# Patient Record
Sex: Male | Born: 1935 | Race: White | Hispanic: No | Marital: Married | Smoking: Never smoker
Health system: Southern US, Community
[De-identification: ages and names within clinical notes are randomized; demographics above are authoritative.]

## PROBLEM LIST (undated history)

## (undated) DIAGNOSIS — T8859XA Other complications of anesthesia, initial encounter: Secondary | ICD-10-CM

## (undated) DIAGNOSIS — Z9889 Other specified postprocedural states: Secondary | ICD-10-CM

## (undated) DIAGNOSIS — M1990 Unspecified osteoarthritis, unspecified site: Secondary | ICD-10-CM

## (undated) DIAGNOSIS — C801 Malignant (primary) neoplasm, unspecified: Secondary | ICD-10-CM

## (undated) DIAGNOSIS — R112 Nausea with vomiting, unspecified: Secondary | ICD-10-CM

## (undated) DIAGNOSIS — T4145XA Adverse effect of unspecified anesthetic, initial encounter: Secondary | ICD-10-CM

## (undated) DIAGNOSIS — H409 Unspecified glaucoma: Secondary | ICD-10-CM

## (undated) HISTORY — PX: HEMORRHOID SURGERY: SHX153

## (undated) HISTORY — PX: TONSILLECTOMY: SUR1361

## (undated) HISTORY — PX: COLONOSCOPY: SHX174

---
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
## 1997-11-30 HISTORY — PX: PROSTATE SURGERY: SHX751

## 2012-08-15 DIAGNOSIS — H409 Unspecified glaucoma: Secondary | ICD-10-CM | POA: Insufficient documentation

## 2012-08-15 DIAGNOSIS — H269 Unspecified cataract: Secondary | ICD-10-CM | POA: Insufficient documentation

## 2016-01-21 DIAGNOSIS — F325 Major depressive disorder, single episode, in full remission: Secondary | ICD-10-CM | POA: Diagnosis not present

## 2016-02-05 DIAGNOSIS — H401131 Primary open-angle glaucoma, bilateral, mild stage: Secondary | ICD-10-CM | POA: Diagnosis not present

## 2016-02-11 DIAGNOSIS — E784 Other hyperlipidemia: Secondary | ICD-10-CM | POA: Diagnosis not present

## 2016-02-11 DIAGNOSIS — Z125 Encounter for screening for malignant neoplasm of prostate: Secondary | ICD-10-CM | POA: Diagnosis not present

## 2016-02-17 DIAGNOSIS — E784 Other hyperlipidemia: Secondary | ICD-10-CM | POA: Diagnosis not present

## 2016-02-17 DIAGNOSIS — L570 Actinic keratosis: Secondary | ICD-10-CM | POA: Diagnosis not present

## 2016-02-17 DIAGNOSIS — Z6828 Body mass index (BMI) 28.0-28.9, adult: Secondary | ICD-10-CM | POA: Diagnosis not present

## 2016-02-17 DIAGNOSIS — Z0000 Encounter for general adult medical examination without abnormal findings: Secondary | ICD-10-CM | POA: Diagnosis not present

## 2016-02-17 DIAGNOSIS — D126 Benign neoplasm of colon, unspecified: Secondary | ICD-10-CM | POA: Diagnosis not present

## 2016-02-17 DIAGNOSIS — C61 Malignant neoplasm of prostate: Secondary | ICD-10-CM | POA: Diagnosis not present

## 2016-02-17 DIAGNOSIS — M859 Disorder of bone density and structure, unspecified: Secondary | ICD-10-CM | POA: Diagnosis not present

## 2016-02-17 DIAGNOSIS — H4089 Other specified glaucoma: Secondary | ICD-10-CM | POA: Diagnosis not present

## 2016-02-17 DIAGNOSIS — Z1389 Encounter for screening for other disorder: Secondary | ICD-10-CM | POA: Diagnosis not present

## 2016-03-17 DIAGNOSIS — Z85828 Personal history of other malignant neoplasm of skin: Secondary | ICD-10-CM | POA: Diagnosis not present

## 2016-03-17 DIAGNOSIS — L821 Other seborrheic keratosis: Secondary | ICD-10-CM | POA: Diagnosis not present

## 2016-03-17 DIAGNOSIS — Z08 Encounter for follow-up examination after completed treatment for malignant neoplasm: Secondary | ICD-10-CM | POA: Diagnosis not present

## 2016-09-08 DIAGNOSIS — Z23 Encounter for immunization: Secondary | ICD-10-CM | POA: Diagnosis not present

## 2016-09-22 DIAGNOSIS — D0439 Carcinoma in situ of skin of other parts of face: Secondary | ICD-10-CM | POA: Diagnosis not present

## 2016-09-22 DIAGNOSIS — L570 Actinic keratosis: Secondary | ICD-10-CM | POA: Diagnosis not present

## 2016-09-22 DIAGNOSIS — L821 Other seborrheic keratosis: Secondary | ICD-10-CM | POA: Diagnosis not present

## 2016-09-22 DIAGNOSIS — Z85828 Personal history of other malignant neoplasm of skin: Secondary | ICD-10-CM | POA: Diagnosis not present

## 2016-09-22 DIAGNOSIS — Z08 Encounter for follow-up examination after completed treatment for malignant neoplasm: Secondary | ICD-10-CM | POA: Diagnosis not present

## 2016-10-13 DIAGNOSIS — E785 Hyperlipidemia, unspecified: Secondary | ICD-10-CM | POA: Diagnosis not present

## 2016-10-20 DIAGNOSIS — H409 Unspecified glaucoma: Secondary | ICD-10-CM | POA: Diagnosis not present

## 2016-10-20 DIAGNOSIS — R5383 Other fatigue: Secondary | ICD-10-CM | POA: Diagnosis not present

## 2016-10-20 DIAGNOSIS — C61 Malignant neoplasm of prostate: Secondary | ICD-10-CM | POA: Diagnosis not present

## 2016-10-20 DIAGNOSIS — E785 Hyperlipidemia, unspecified: Secondary | ICD-10-CM | POA: Diagnosis not present

## 2016-10-20 DIAGNOSIS — Z23 Encounter for immunization: Secondary | ICD-10-CM | POA: Diagnosis not present

## 2016-10-20 DIAGNOSIS — Z0000 Encounter for general adult medical examination without abnormal findings: Secondary | ICD-10-CM | POA: Diagnosis not present

## 2017-03-15 DIAGNOSIS — Z125 Encounter for screening for malignant neoplasm of prostate: Secondary | ICD-10-CM | POA: Diagnosis not present

## 2017-03-15 DIAGNOSIS — E784 Other hyperlipidemia: Secondary | ICD-10-CM | POA: Diagnosis not present

## 2017-03-15 DIAGNOSIS — M859 Disorder of bone density and structure, unspecified: Secondary | ICD-10-CM | POA: Diagnosis not present

## 2017-03-22 DIAGNOSIS — E784 Other hyperlipidemia: Secondary | ICD-10-CM | POA: Diagnosis not present

## 2017-03-22 DIAGNOSIS — Z1389 Encounter for screening for other disorder: Secondary | ICD-10-CM | POA: Diagnosis not present

## 2017-03-22 DIAGNOSIS — C61 Malignant neoplasm of prostate: Secondary | ICD-10-CM | POA: Diagnosis not present

## 2017-03-22 DIAGNOSIS — M859 Disorder of bone density and structure, unspecified: Secondary | ICD-10-CM | POA: Diagnosis not present

## 2017-03-22 DIAGNOSIS — Z6828 Body mass index (BMI) 28.0-28.9, adult: Secondary | ICD-10-CM | POA: Diagnosis not present

## 2017-03-22 DIAGNOSIS — Z23 Encounter for immunization: Secondary | ICD-10-CM | POA: Diagnosis not present

## 2017-03-22 DIAGNOSIS — Z0000 Encounter for general adult medical examination without abnormal findings: Secondary | ICD-10-CM | POA: Diagnosis not present

## 2017-03-22 DIAGNOSIS — D126 Benign neoplasm of colon, unspecified: Secondary | ICD-10-CM | POA: Diagnosis not present

## 2017-03-22 DIAGNOSIS — L570 Actinic keratosis: Secondary | ICD-10-CM | POA: Diagnosis not present

## 2017-03-22 DIAGNOSIS — H4089 Other specified glaucoma: Secondary | ICD-10-CM | POA: Diagnosis not present

## 2017-03-23 DIAGNOSIS — Z1212 Encounter for screening for malignant neoplasm of rectum: Secondary | ICD-10-CM | POA: Diagnosis not present

## 2017-03-23 DIAGNOSIS — Z08 Encounter for follow-up examination after completed treatment for malignant neoplasm: Secondary | ICD-10-CM | POA: Diagnosis not present

## 2017-03-23 DIAGNOSIS — L570 Actinic keratosis: Secondary | ICD-10-CM | POA: Diagnosis not present

## 2017-03-23 DIAGNOSIS — Z85828 Personal history of other malignant neoplasm of skin: Secondary | ICD-10-CM | POA: Diagnosis not present

## 2017-03-23 DIAGNOSIS — L821 Other seborrheic keratosis: Secondary | ICD-10-CM | POA: Diagnosis not present

## 2017-05-11 DIAGNOSIS — H401131 Primary open-angle glaucoma, bilateral, mild stage: Secondary | ICD-10-CM | POA: Diagnosis not present

## 2017-05-11 DIAGNOSIS — H524 Presbyopia: Secondary | ICD-10-CM | POA: Diagnosis not present

## 2017-05-28 DIAGNOSIS — I8311 Varicose veins of right lower extremity with inflammation: Secondary | ICD-10-CM | POA: Diagnosis not present

## 2017-05-28 DIAGNOSIS — I8312 Varicose veins of left lower extremity with inflammation: Secondary | ICD-10-CM | POA: Diagnosis not present

## 2017-05-28 DIAGNOSIS — C44712 Basal cell carcinoma of skin of right lower limb, including hip: Secondary | ICD-10-CM | POA: Diagnosis not present

## 2017-08-31 DIAGNOSIS — Z85828 Personal history of other malignant neoplasm of skin: Secondary | ICD-10-CM | POA: Diagnosis not present

## 2017-08-31 DIAGNOSIS — L821 Other seborrheic keratosis: Secondary | ICD-10-CM | POA: Diagnosis not present

## 2017-08-31 DIAGNOSIS — Z08 Encounter for follow-up examination after completed treatment for malignant neoplasm: Secondary | ICD-10-CM | POA: Diagnosis not present

## 2017-09-03 DIAGNOSIS — Z23 Encounter for immunization: Secondary | ICD-10-CM | POA: Diagnosis not present

## 2017-10-05 DIAGNOSIS — H401131 Primary open-angle glaucoma, bilateral, mild stage: Secondary | ICD-10-CM | POA: Diagnosis not present

## 2018-04-05 DIAGNOSIS — H524 Presbyopia: Secondary | ICD-10-CM | POA: Diagnosis not present

## 2018-04-05 DIAGNOSIS — H401131 Primary open-angle glaucoma, bilateral, mild stage: Secondary | ICD-10-CM | POA: Diagnosis not present

## 2018-04-07 DIAGNOSIS — L570 Actinic keratosis: Secondary | ICD-10-CM | POA: Diagnosis not present

## 2018-04-07 DIAGNOSIS — D485 Neoplasm of uncertain behavior of skin: Secondary | ICD-10-CM | POA: Diagnosis not present

## 2018-04-07 DIAGNOSIS — Z08 Encounter for follow-up examination after completed treatment for malignant neoplasm: Secondary | ICD-10-CM | POA: Diagnosis not present

## 2018-04-07 DIAGNOSIS — C44229 Squamous cell carcinoma of skin of left ear and external auricular canal: Secondary | ICD-10-CM | POA: Diagnosis not present

## 2018-04-07 DIAGNOSIS — Z85828 Personal history of other malignant neoplasm of skin: Secondary | ICD-10-CM | POA: Diagnosis not present

## 2018-04-07 DIAGNOSIS — L821 Other seborrheic keratosis: Secondary | ICD-10-CM | POA: Diagnosis not present

## 2018-04-14 DIAGNOSIS — Z125 Encounter for screening for malignant neoplasm of prostate: Secondary | ICD-10-CM | POA: Diagnosis not present

## 2018-04-14 DIAGNOSIS — R82998 Other abnormal findings in urine: Secondary | ICD-10-CM | POA: Diagnosis not present

## 2018-04-14 DIAGNOSIS — M859 Disorder of bone density and structure, unspecified: Secondary | ICD-10-CM | POA: Diagnosis not present

## 2018-04-14 DIAGNOSIS — E7849 Other hyperlipidemia: Secondary | ICD-10-CM | POA: Diagnosis not present

## 2018-04-21 DIAGNOSIS — C61 Malignant neoplasm of prostate: Secondary | ICD-10-CM | POA: Diagnosis not present

## 2018-04-21 DIAGNOSIS — Z0000 Encounter for general adult medical examination without abnormal findings: Secondary | ICD-10-CM | POA: Diagnosis not present

## 2018-04-21 DIAGNOSIS — Z6828 Body mass index (BMI) 28.0-28.9, adult: Secondary | ICD-10-CM | POA: Diagnosis not present

## 2018-04-21 DIAGNOSIS — D126 Benign neoplasm of colon, unspecified: Secondary | ICD-10-CM | POA: Diagnosis not present

## 2018-04-21 DIAGNOSIS — H4089 Other specified glaucoma: Secondary | ICD-10-CM | POA: Diagnosis not present

## 2018-04-21 DIAGNOSIS — H6123 Impacted cerumen, bilateral: Secondary | ICD-10-CM | POA: Diagnosis not present

## 2018-04-21 DIAGNOSIS — L570 Actinic keratosis: Secondary | ICD-10-CM | POA: Diagnosis not present

## 2018-04-21 DIAGNOSIS — E7849 Other hyperlipidemia: Secondary | ICD-10-CM | POA: Diagnosis not present

## 2018-04-21 DIAGNOSIS — M859 Disorder of bone density and structure, unspecified: Secondary | ICD-10-CM | POA: Diagnosis not present

## 2018-04-21 DIAGNOSIS — Z1389 Encounter for screening for other disorder: Secondary | ICD-10-CM | POA: Diagnosis not present

## 2018-04-22 DIAGNOSIS — Z1212 Encounter for screening for malignant neoplasm of rectum: Secondary | ICD-10-CM | POA: Diagnosis not present

## 2018-05-31 DIAGNOSIS — H401131 Primary open-angle glaucoma, bilateral, mild stage: Secondary | ICD-10-CM | POA: Diagnosis not present

## 2018-08-30 DIAGNOSIS — Z23 Encounter for immunization: Secondary | ICD-10-CM | POA: Diagnosis not present

## 2018-09-15 DIAGNOSIS — R5383 Other fatigue: Secondary | ICD-10-CM | POA: Diagnosis not present

## 2018-09-15 DIAGNOSIS — Z6828 Body mass index (BMI) 28.0-28.9, adult: Secondary | ICD-10-CM | POA: Diagnosis not present

## 2018-09-15 DIAGNOSIS — E875 Hyperkalemia: Secondary | ICD-10-CM | POA: Diagnosis not present

## 2018-09-15 DIAGNOSIS — R05 Cough: Secondary | ICD-10-CM | POA: Diagnosis not present

## 2018-09-16 DIAGNOSIS — E875 Hyperkalemia: Secondary | ICD-10-CM | POA: Diagnosis not present

## 2018-09-26 ENCOUNTER — Other Ambulatory Visit: Payer: Self-pay | Admitting: Internal Medicine

## 2018-09-26 ENCOUNTER — Ambulatory Visit
Admission: RE | Admit: 2018-09-26 | Discharge: 2018-09-26 | Disposition: A | Discharge status: Home / Self Care | Payer: Self-pay | Source: Ambulatory Visit | Attending: Internal Medicine | Admitting: Internal Medicine

## 2018-09-26 DIAGNOSIS — R945 Abnormal results of liver function studies: Principal | ICD-10-CM

## 2018-09-26 DIAGNOSIS — R05 Cough: Secondary | ICD-10-CM | POA: Diagnosis not present

## 2018-09-26 DIAGNOSIS — R509 Fever, unspecified: Secondary | ICD-10-CM | POA: Diagnosis not present

## 2018-09-26 DIAGNOSIS — D72829 Elevated white blood cell count, unspecified: Secondary | ICD-10-CM | POA: Diagnosis not present

## 2018-09-26 DIAGNOSIS — R7989 Other specified abnormal findings of blood chemistry: Secondary | ICD-10-CM | POA: Diagnosis not present

## 2018-09-26 DIAGNOSIS — H6692 Otitis media, unspecified, left ear: Secondary | ICD-10-CM | POA: Diagnosis not present

## 2018-09-26 DIAGNOSIS — Z6827 Body mass index (BMI) 27.0-27.9, adult: Secondary | ICD-10-CM | POA: Diagnosis not present

## 2018-09-26 DIAGNOSIS — J069 Acute upper respiratory infection, unspecified: Secondary | ICD-10-CM | POA: Diagnosis not present

## 2018-09-26 MED ORDER — IOPAMIDOL (ISOVUE-300) INJECTION 61%
100.0000 mL | Freq: Once | INTRAVENOUS | Status: AC | PRN
  Administered 2018-09-26: 100 mL via INTRAVENOUS

## 2018-10-03 DIAGNOSIS — K819 Cholecystitis, unspecified: Secondary | ICD-10-CM | POA: Diagnosis not present

## 2018-10-03 DIAGNOSIS — R509 Fever, unspecified: Secondary | ICD-10-CM | POA: Diagnosis not present

## 2018-10-03 DIAGNOSIS — J069 Acute upper respiratory infection, unspecified: Secondary | ICD-10-CM | POA: Diagnosis not present

## 2018-10-03 DIAGNOSIS — Z6827 Body mass index (BMI) 27.0-27.9, adult: Secondary | ICD-10-CM | POA: Diagnosis not present

## 2018-10-03 DIAGNOSIS — D72829 Elevated white blood cell count, unspecified: Secondary | ICD-10-CM | POA: Diagnosis not present

## 2018-10-03 DIAGNOSIS — R7989 Other specified abnormal findings of blood chemistry: Secondary | ICD-10-CM | POA: Diagnosis not present

## 2018-10-03 DIAGNOSIS — H538 Other visual disturbances: Secondary | ICD-10-CM | POA: Diagnosis not present

## 2018-10-03 DIAGNOSIS — H6692 Otitis media, unspecified, left ear: Secondary | ICD-10-CM | POA: Diagnosis not present

## 2018-10-04 DIAGNOSIS — H2513 Age-related nuclear cataract, bilateral: Secondary | ICD-10-CM | POA: Diagnosis not present

## 2018-10-04 DIAGNOSIS — H401131 Primary open-angle glaucoma, bilateral, mild stage: Secondary | ICD-10-CM | POA: Diagnosis not present

## 2018-10-06 ENCOUNTER — Other Ambulatory Visit: Payer: Self-pay | Admitting: Surgery

## 2018-10-06 ENCOUNTER — Other Ambulatory Visit (HOSPITAL_COMMUNITY): Payer: Self-pay | Admitting: Surgery

## 2018-10-06 DIAGNOSIS — K8020 Calculus of gallbladder without cholecystitis without obstruction: Secondary | ICD-10-CM

## 2018-10-07 ENCOUNTER — Ambulatory Visit (HOSPITAL_COMMUNITY)
Admission: RE | Admit: 2018-10-07 | Discharge: 2018-10-07 | Disposition: A | Discharge status: Home / Self Care | Payer: Medicare Other | Source: Ambulatory Visit | Attending: Surgery | Admitting: Surgery

## 2018-10-07 DIAGNOSIS — K828 Other specified diseases of gallbladder: Secondary | ICD-10-CM | POA: Diagnosis not present

## 2018-10-07 DIAGNOSIS — K8020 Calculus of gallbladder without cholecystitis without obstruction: Secondary | ICD-10-CM | POA: Diagnosis not present

## 2018-10-13 ENCOUNTER — Ambulatory Visit
Admission: RE | Admit: 2018-10-13 | Discharge: 2018-10-13 | Disposition: A | Discharge status: Home / Self Care | Payer: Medicare Other | Source: Ambulatory Visit | Attending: Internal Medicine | Admitting: Internal Medicine

## 2018-10-13 ENCOUNTER — Other Ambulatory Visit: Payer: Self-pay | Admitting: Internal Medicine

## 2018-10-13 DIAGNOSIS — R059 Cough, unspecified: Secondary | ICD-10-CM

## 2018-10-13 DIAGNOSIS — R634 Abnormal weight loss: Secondary | ICD-10-CM | POA: Diagnosis not present

## 2018-10-13 DIAGNOSIS — D72829 Elevated white blood cell count, unspecified: Secondary | ICD-10-CM | POA: Diagnosis not present

## 2018-10-13 DIAGNOSIS — R61 Generalized hyperhidrosis: Secondary | ICD-10-CM

## 2018-10-13 DIAGNOSIS — Z6827 Body mass index (BMI) 27.0-27.9, adult: Secondary | ICD-10-CM | POA: Diagnosis not present

## 2018-10-13 DIAGNOSIS — R062 Wheezing: Secondary | ICD-10-CM | POA: Diagnosis not present

## 2018-10-13 DIAGNOSIS — R7989 Other specified abnormal findings of blood chemistry: Secondary | ICD-10-CM | POA: Diagnosis not present

## 2018-10-13 DIAGNOSIS — R05 Cough: Secondary | ICD-10-CM

## 2018-10-13 DIAGNOSIS — R509 Fever, unspecified: Secondary | ICD-10-CM | POA: Diagnosis not present

## 2018-10-13 DIAGNOSIS — K819 Cholecystitis, unspecified: Secondary | ICD-10-CM | POA: Diagnosis not present

## 2018-10-13 MED ORDER — IOPAMIDOL (ISOVUE-300) INJECTION 61%
75.0000 mL | Freq: Once | INTRAVENOUS | Status: AC | PRN
  Administered 2018-10-13: 75 mL via INTRAVENOUS

## 2018-10-26 DIAGNOSIS — Z6826 Body mass index (BMI) 26.0-26.9, adult: Secondary | ICD-10-CM | POA: Diagnosis not present

## 2018-10-26 DIAGNOSIS — R61 Generalized hyperhidrosis: Secondary | ICD-10-CM | POA: Diagnosis not present

## 2018-10-26 DIAGNOSIS — R05 Cough: Secondary | ICD-10-CM | POA: Diagnosis not present

## 2018-10-26 DIAGNOSIS — K5909 Other constipation: Secondary | ICD-10-CM | POA: Diagnosis not present

## 2018-10-26 DIAGNOSIS — R7989 Other specified abnormal findings of blood chemistry: Secondary | ICD-10-CM | POA: Diagnosis not present

## 2018-10-26 DIAGNOSIS — R634 Abnormal weight loss: Secondary | ICD-10-CM | POA: Diagnosis not present

## 2018-11-02 DIAGNOSIS — R748 Abnormal levels of other serum enzymes: Secondary | ICD-10-CM | POA: Diagnosis not present

## 2018-11-02 DIAGNOSIS — K8020 Calculus of gallbladder without cholecystitis without obstruction: Secondary | ICD-10-CM | POA: Diagnosis not present

## 2018-11-02 DIAGNOSIS — R630 Anorexia: Secondary | ICD-10-CM | POA: Diagnosis not present

## 2018-11-02 DIAGNOSIS — R634 Abnormal weight loss: Secondary | ICD-10-CM | POA: Diagnosis not present

## 2018-11-03 ENCOUNTER — Encounter: Payer: Self-pay | Admitting: Internal Medicine

## 2018-11-03 ENCOUNTER — Ambulatory Visit (INDEPENDENT_AMBULATORY_CARE_PROVIDER_SITE_OTHER): Payer: Medicare Other | Admitting: Internal Medicine

## 2018-11-03 ENCOUNTER — Other Ambulatory Visit: Payer: Self-pay

## 2018-11-03 DIAGNOSIS — B349 Viral infection, unspecified: Secondary | ICD-10-CM

## 2018-11-03 DIAGNOSIS — K838 Other specified diseases of biliary tract: Secondary | ICD-10-CM | POA: Diagnosis not present

## 2018-11-03 DIAGNOSIS — R748 Abnormal levels of other serum enzymes: Secondary | ICD-10-CM | POA: Diagnosis not present

## 2018-11-03 DIAGNOSIS — R634 Abnormal weight loss: Secondary | ICD-10-CM | POA: Diagnosis not present

## 2018-11-03 DIAGNOSIS — R61 Generalized hyperhidrosis: Secondary | ICD-10-CM | POA: Insufficient documentation

## 2018-11-03 DIAGNOSIS — R630 Anorexia: Secondary | ICD-10-CM | POA: Diagnosis not present

## 2018-11-03 DIAGNOSIS — K752 Nonspecific reactive hepatitis: Secondary | ICD-10-CM | POA: Diagnosis not present

## 2018-11-03 DIAGNOSIS — K719 Toxic liver disease, unspecified: Secondary | ICD-10-CM | POA: Diagnosis not present

## 2018-11-03 NOTE — Progress Notes (Signed)
Patient ID: Kevin Hoover, male   DOB: [birth year], 84 y.o.   MRN: [id number]     [facility]      Reason for Consult: night sweats    Referring Physician: Dr. Joylene Draft    Patient ID: Kevin Hoover, male    DOB: Apr 14, 1933, 84 y.o.   MRN: [id number]  HPI:   He is here for evaluation of night sweats, cough and headache that have been going on for about 2 months.   Fortunately, at this time his symptoms have nearly resolved.  He though has had a prolonged course of what started as a significant headache about 2 months ago that has been relatively persistent throughout this 4-monthperiod, though resolved now, and was treated with over-the-counter therapy and then he noted a productive cough, night sweats that included soaking his pajamas 2-3 times a night and some weight loss.  For his weight loss, he has had very poor oral intake due to lack of appetite during this and lost about 20 pounds.  He has had no associated rash, diarrhea, joint swelling or arthralgias.  [place] did [place] with a starlike rash that is now resolved.  Evaluation by Dr. PJoylene Drafthas included appropriate labs which did note some increase in his alk phos with LFT elevations and follow-up ultrasound did note some biliary stones and sludge.  He though has been asymptomatic from that standpoint with no pain with eating.  Other labs were relatively non-significant with just a minimal elevation of his white count while on steroids.  He has been both on steroids and cefdinir and him and his wife report that neither had any benefit to his symptoms.  He did get a CT scan of his chest and abdomen with no significant abnormalities.  He does still have a mild persistent cough that is now dry but no significant shortness of breath, no dyspnea on exertion.  He is having no fever, no chills.  He has seen surgery due to the gallstones but surgery has been deferred at this  time.  He also has seen gastroenterology.  He did have significant constipation which has been relieved with an enema and MiraLAX. Previous record reviewed from Dr. PJoylene Draftincluding labs as noted above, CT scans.    No past medical history on file.  Prior to Admission medications   Medication Sig Start Date End Date Taking? Authorizing Provider  albuterol (VENTOLIN HFA) 108 (90 Base) MCG/ACT inhaler Take 2 puffs by mouth every 6 (six) hours as needed. 10/13/18   [provider]  aspirin EC 81 MG tablet Take 1 tablet by mouth daily.    [provider]  Co-Enzyme Q-10 30 MG CAPS Take 1 capsule by mouth daily.    [provider]  polyethylene glycol (MIRALAX / GLYCOLAX) packet Take 17 g by mouth daily.    [provider]  rosuvastatin (CRESTOR) 10 MG tablet Take 10 mg by mouth daily. 09/09/18   [provider]  timolol (TIMOPTIC) 0.5 % ophthalmic solution Place 1 drop into both eyes daily. 04/24/15   [provider]  VITAMIN D, CHOLECALCIFEROL, PO Take 1 tablet by mouth daily.    [provider]    Allergies  Allergen Reactions  . Atorvastatin Calcium Rash  . Vardenafil Other (See Comments)    headaches    Social History   Tobacco Use  . Smoking status: Never Smoker  .  Smokeless tobacco: Never Used  Substance Use Topics  . Alcohol use: Not Currently  . Drug use: Not Currently   [place]: No rheumatic disease  Review of Systems  Constitutional: positive for anorexia and weight loss or negative for fevers and chills Respiratory: positive for cough, negative for sputum, hemoptysis or dyspnea on exertion Gastrointestinal: negative for nausea and diarrhea; recent change in bowel habits Integument/breast: negative for rash Musculoskeletal: negative for myalgias and arthralgias Neurological: negative for dizziness All other systems reviewed and are negative    Constitutional: in no apparent distress and alert  Vitals:    11/03/18 0852  BP: 138/76  Pulse: 98   EYES: anicteric ENMT:no thrush Cardiovascular: Cor RRR Respiratory: CTA B; normal respiratory effort GI: soft, nt Musculoskeletal: no pedal edema noted Skin: small area on left wrist of dry, scaly skin.  Hematologic: no cervical lad  Labs: No results found for: WBC, HGB, HCT, MCV, PLT No results found for: CREATININE, BUN, NA, K, CL, CO2 No results found for: ALT, AST, GGT, ALKPHOS, BILITOT, INR   Assessment: Fortunately, this is all resolving.  No further work-up indicated at this time because of lack of symptoms.  I did encourage him to increase his weight with eating, nutritional supplements and improving his diet so he can get back his energy.  He was asking if he had should be tested for hepatitis C and I will defer this to his primary doctor since it may have been done already.  He was advised to return if he gets any new symptoms or worsening symptoms.  My suspicion is that he had acute bronchitis causing the productive sputum and prolonged course which is consistent with a viral etiology.  Regardless, no concerning signs at this time.  Plan: 1) continue supportive care

## 2018-11-03 NOTE — Progress Notes (Signed)
Patient most s/sx has resolved and not been taking daily medications for about 2 months

## 2018-12-12 DIAGNOSIS — E7849 Other hyperlipidemia: Secondary | ICD-10-CM | POA: Diagnosis not present

## 2018-12-12 DIAGNOSIS — Z6827 Body mass index (BMI) 27.0-27.9, adult: Secondary | ICD-10-CM | POA: Diagnosis not present

## 2018-12-12 DIAGNOSIS — K8020 Calculus of gallbladder without cholecystitis without obstruction: Secondary | ICD-10-CM | POA: Diagnosis not present

## 2018-12-12 DIAGNOSIS — K5909 Other constipation: Secondary | ICD-10-CM | POA: Diagnosis not present

## 2018-12-12 DIAGNOSIS — R7989 Other specified abnormal findings of blood chemistry: Secondary | ICD-10-CM | POA: Diagnosis not present

## 2018-12-12 DIAGNOSIS — H4089 Other specified glaucoma: Secondary | ICD-10-CM | POA: Diagnosis not present

## 2019-01-25 DIAGNOSIS — I872 Venous insufficiency (chronic) (peripheral): Secondary | ICD-10-CM | POA: Diagnosis not present

## 2019-01-25 DIAGNOSIS — G629 Polyneuropathy, unspecified: Secondary | ICD-10-CM | POA: Diagnosis not present

## 2019-04-04 DIAGNOSIS — M129 Arthropathy, unspecified: Secondary | ICD-10-CM | POA: Diagnosis not present

## 2019-04-04 DIAGNOSIS — R600 Localized edema: Secondary | ICD-10-CM | POA: Diagnosis not present

## 2019-04-04 DIAGNOSIS — M25562 Pain in left knee: Secondary | ICD-10-CM | POA: Diagnosis not present

## 2019-04-11 DIAGNOSIS — L820 Inflamed seborrheic keratosis: Secondary | ICD-10-CM | POA: Diagnosis not present

## 2019-04-11 DIAGNOSIS — Z85828 Personal history of other malignant neoplasm of skin: Secondary | ICD-10-CM | POA: Diagnosis not present

## 2019-04-11 DIAGNOSIS — D485 Neoplasm of uncertain behavior of skin: Secondary | ICD-10-CM | POA: Diagnosis not present

## 2019-04-11 DIAGNOSIS — L570 Actinic keratosis: Secondary | ICD-10-CM | POA: Diagnosis not present

## 2019-04-11 DIAGNOSIS — L2089 Other atopic dermatitis: Secondary | ICD-10-CM | POA: Diagnosis not present

## 2019-04-11 DIAGNOSIS — L821 Other seborrheic keratosis: Secondary | ICD-10-CM | POA: Diagnosis not present

## 2019-05-04 DIAGNOSIS — M1712 Unilateral primary osteoarthritis, left knee: Secondary | ICD-10-CM | POA: Diagnosis not present

## 2019-05-12 DIAGNOSIS — M859 Disorder of bone density and structure, unspecified: Secondary | ICD-10-CM | POA: Diagnosis not present

## 2019-05-12 DIAGNOSIS — E7849 Other hyperlipidemia: Secondary | ICD-10-CM | POA: Diagnosis not present

## 2019-05-12 DIAGNOSIS — Z125 Encounter for screening for malignant neoplasm of prostate: Secondary | ICD-10-CM | POA: Diagnosis not present

## 2019-05-15 DIAGNOSIS — R82998 Other abnormal findings in urine: Secondary | ICD-10-CM | POA: Diagnosis not present

## 2019-05-18 DIAGNOSIS — R7989 Other specified abnormal findings of blood chemistry: Secondary | ICD-10-CM | POA: Diagnosis not present

## 2019-05-18 DIAGNOSIS — K8020 Calculus of gallbladder without cholecystitis without obstruction: Secondary | ICD-10-CM | POA: Diagnosis not present

## 2019-05-18 DIAGNOSIS — M129 Arthropathy, unspecified: Secondary | ICD-10-CM | POA: Diagnosis not present

## 2019-05-18 DIAGNOSIS — Z1331 Encounter for screening for depression: Secondary | ICD-10-CM | POA: Diagnosis not present

## 2019-05-18 DIAGNOSIS — M25562 Pain in left knee: Secondary | ICD-10-CM | POA: Diagnosis not present

## 2019-05-18 DIAGNOSIS — R413 Other amnesia: Secondary | ICD-10-CM | POA: Diagnosis not present

## 2019-05-18 DIAGNOSIS — R5383 Other fatigue: Secondary | ICD-10-CM | POA: Diagnosis not present

## 2019-05-18 DIAGNOSIS — M8580 Other specified disorders of bone density and structure, unspecified site: Secondary | ICD-10-CM | POA: Diagnosis not present

## 2019-05-18 DIAGNOSIS — D72829 Elevated white blood cell count, unspecified: Secondary | ICD-10-CM | POA: Diagnosis not present

## 2019-05-18 DIAGNOSIS — L570 Actinic keratosis: Secondary | ICD-10-CM | POA: Diagnosis not present

## 2019-05-18 DIAGNOSIS — Z0000 Encounter for general adult medical examination without abnormal findings: Secondary | ICD-10-CM | POA: Diagnosis not present

## 2019-05-18 DIAGNOSIS — E875 Hyperkalemia: Secondary | ICD-10-CM | POA: Diagnosis not present

## 2019-05-18 DIAGNOSIS — R600 Localized edema: Secondary | ICD-10-CM | POA: Diagnosis not present

## 2019-05-18 DIAGNOSIS — G629 Polyneuropathy, unspecified: Secondary | ICD-10-CM | POA: Diagnosis not present

## 2019-05-18 DIAGNOSIS — E785 Hyperlipidemia, unspecified: Secondary | ICD-10-CM | POA: Diagnosis not present

## 2019-05-19 ENCOUNTER — Encounter: Payer: Self-pay | Admitting: Neurology

## 2019-05-26 ENCOUNTER — Emergency Department (HOSPITAL_COMMUNITY): Payer: Medicare Other

## 2019-05-26 ENCOUNTER — Encounter (HOSPITAL_COMMUNITY): Payer: Self-pay | Admitting: Emergency Medicine

## 2019-05-26 ENCOUNTER — Emergency Department (HOSPITAL_COMMUNITY)
Admission: EM | Admit: 2019-05-26 | Discharge: 2019-05-26 | Disposition: A | Discharge status: Home / Self Care | Payer: Medicare Other | Attending: Emergency Medicine | Admitting: Emergency Medicine

## 2019-05-26 DIAGNOSIS — R1111 Vomiting without nausea: Secondary | ICD-10-CM | POA: Diagnosis not present

## 2019-05-26 DIAGNOSIS — I10 Essential (primary) hypertension: Secondary | ICD-10-CM | POA: Diagnosis not present

## 2019-05-26 DIAGNOSIS — R1084 Generalized abdominal pain: Secondary | ICD-10-CM | POA: Diagnosis not present

## 2019-05-26 DIAGNOSIS — K8020 Calculus of gallbladder without cholecystitis without obstruction: Secondary | ICD-10-CM

## 2019-05-26 DIAGNOSIS — R112 Nausea with vomiting, unspecified: Secondary | ICD-10-CM | POA: Diagnosis not present

## 2019-05-26 DIAGNOSIS — R52 Pain, unspecified: Secondary | ICD-10-CM | POA: Diagnosis not present

## 2019-05-26 DIAGNOSIS — R109 Unspecified abdominal pain: Secondary | ICD-10-CM | POA: Diagnosis not present

## 2019-05-26 DIAGNOSIS — Z79899 Other long term (current) drug therapy: Secondary | ICD-10-CM | POA: Diagnosis not present

## 2019-05-26 DIAGNOSIS — R110 Nausea: Secondary | ICD-10-CM | POA: Diagnosis not present

## 2019-05-26 HISTORY — DX: Unspecified glaucoma: H40.9

## 2019-05-26 LAB — CBC WITH DIFFERENTIAL/PLATELET
Abs Immature Granulocytes: 0.03 10*3/uL (ref 0.00–0.07)
Basophils Absolute: 0 10*3/uL (ref 0.0–0.1)
Basophils Relative: 0 %
Eosinophils Absolute: 0 10*3/uL (ref 0.0–0.5)
Eosinophils Relative: 0 %
HCT: 43.2 % (ref 39.0–52.0)
Hemoglobin: 14.3 g/dL (ref 13.0–17.0)
Immature Granulocytes: 0 %
Lymphocytes Relative: 11 %
Lymphs Abs: 1 10*3/uL (ref 0.7–4.0)
MCH: 31 pg (ref 26.0–34.0)
MCHC: 33.1 g/dL (ref 30.0–36.0)
MCV: 93.5 fL (ref 80.0–100.0)
Monocytes Absolute: 0.4 10*3/uL (ref 0.1–1.0)
Monocytes Relative: 4 %
Neutro Abs: 8 10*3/uL — ABNORMAL HIGH (ref 1.7–7.7)
Neutrophils Relative %: 85 %
Platelets: 152 10*3/uL (ref 150–400)
RBC: 4.62 MIL/uL (ref 4.22–5.81)
RDW: 13.1 % (ref 11.5–15.5)
WBC: 9.4 10*3/uL (ref 4.0–10.5)
nRBC: 0 % (ref 0.0–0.2)

## 2019-05-26 LAB — COMPREHENSIVE METABOLIC PANEL
ALT: 21 U/L (ref 0–44)
AST: 26 U/L (ref 15–41)
Albumin: 3.5 g/dL (ref 3.5–5.0)
Alkaline Phosphatase: 64 U/L (ref 38–126)
Anion gap: 9 (ref 5–15)
BUN: 16 mg/dL (ref 8–23)
CO2: 22 mmol/L (ref 22–32)
Calcium: 8.5 mg/dL — ABNORMAL LOW (ref 8.9–10.3)
Chloride: 105 mmol/L (ref 98–111)
Creatinine, Ser: 0.81 mg/dL (ref 0.61–1.24)
GFR calc Af Amer: >60 mL/min (ref >60)
GFR calc non Af Amer: >60 mL/min (ref >60)
Glucose, Bld: 157 mg/dL — ABNORMAL HIGH (ref 70–99)
Potassium: 3.7 mmol/L (ref 3.5–5.1)
Sodium: 136 mmol/L (ref 135–145)
Total Bilirubin: 1 mg/dL (ref 0.3–1.2)
Total Protein: 5.8 g/dL — ABNORMAL LOW (ref 6.5–8.1)

## 2019-05-26 LAB — URINALYSIS, ROUTINE W REFLEX MICROSCOPIC
Bacteria, UA: NONE SEEN
Bilirubin Urine: NEGATIVE
Glucose, UA: 50 mg/dL — AB
Hgb urine dipstick: NEGATIVE
Ketones, ur: 20 mg/dL — AB
Leukocytes,Ua: NEGATIVE
Nitrite: NEGATIVE
Protein, ur: 30 mg/dL — AB
Specific Gravity, Urine: 1.042 — ABNORMAL HIGH (ref 1.005–1.030)
pH: 6 (ref 5.0–8.0)

## 2019-05-26 LAB — TROPONIN I (HIGH SENSITIVITY)
Troponin I (High Sensitivity): 7 ng/L (ref <18)
Troponin I (High Sensitivity): 12 ng/L (ref <18)

## 2019-05-26 LAB — LIPASE, BLOOD: Lipase: 33 U/L (ref 11–51)

## 2019-05-26 IMAGING — CT CT ABDOMEN AND PELVIS WITH CONTRAST
2 of 5 series · 17 of 46 positions shown, 19 images · IV contrast (iopamidol)
Comparison: Right upper quadrant ultrasound from same day. CT
abdomen pelvis dated [DATE].

CLINICAL DATA: Nausea and vomiting.

EXAM:
CT ABDOMEN AND PELVIS WITH CONTRAST
TECHNIQUE: Multidetector CT imaging of the abdomen and pelvis was performed
using the standard protocol following bolus administration of
intravenous contrast.
CONTRAST:  100mL [8I] IOPAMIDOL ([8I]) INJECTION 61%

[Series 3: abd/ pelvis 5.0 i30f 2 · axial · 0.84mm/px · z∈[+815,+1285]mm · 14 of 106 slices shown, 16 images]
[im 6/106  soft-tissue]
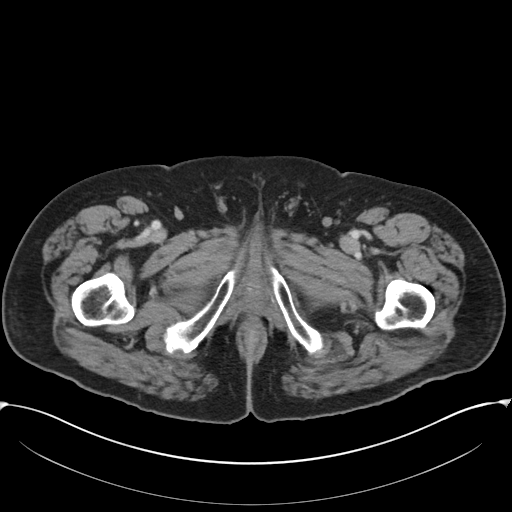
[im 6/106  bone]
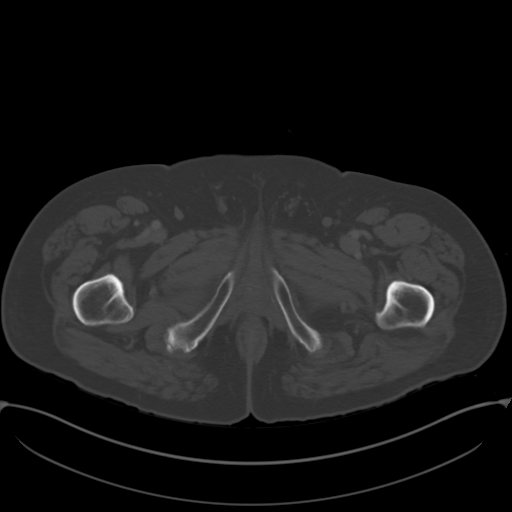
[im 16/106  soft-tissue]
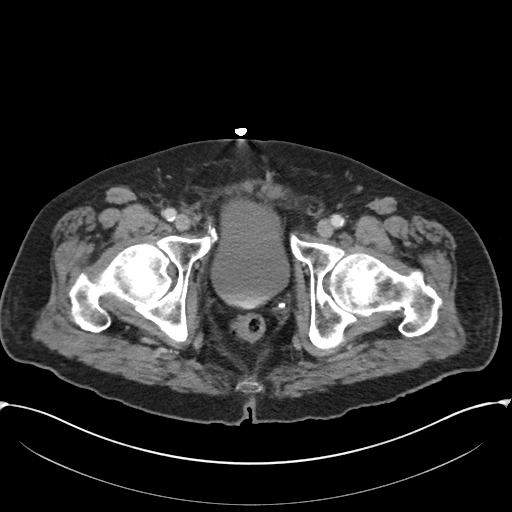
[im 22/106  soft-tissue]
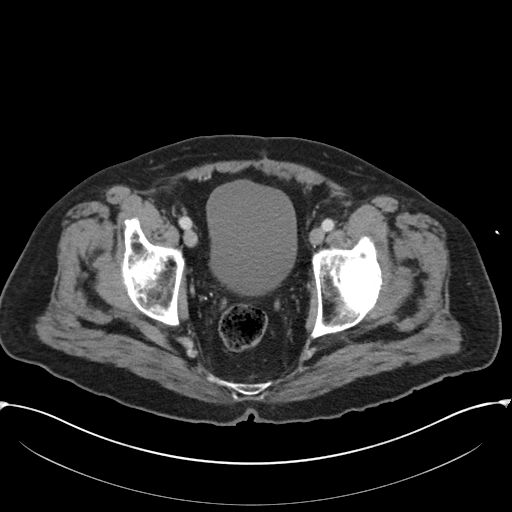
[im 27/106  soft-tissue]
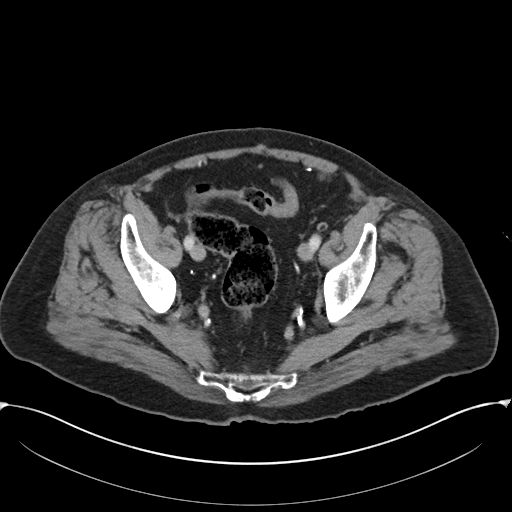
[im 37/106  soft-tissue]
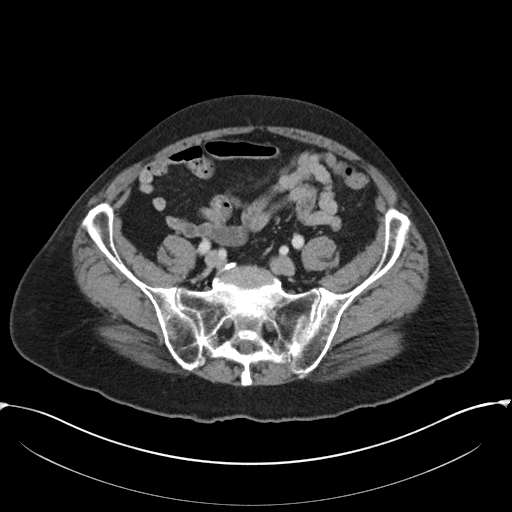
[im 43/106  soft-tissue]
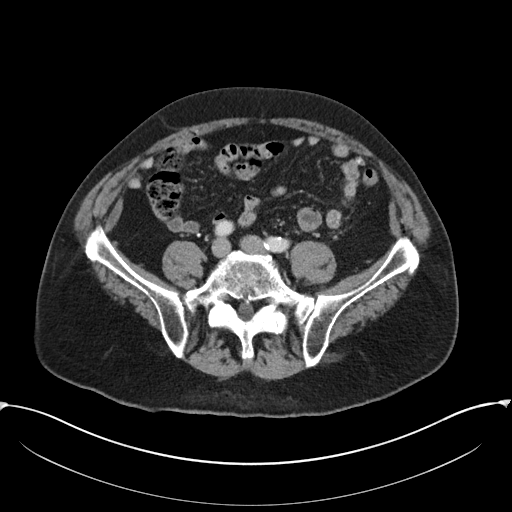
[im 48/106  soft-tissue]
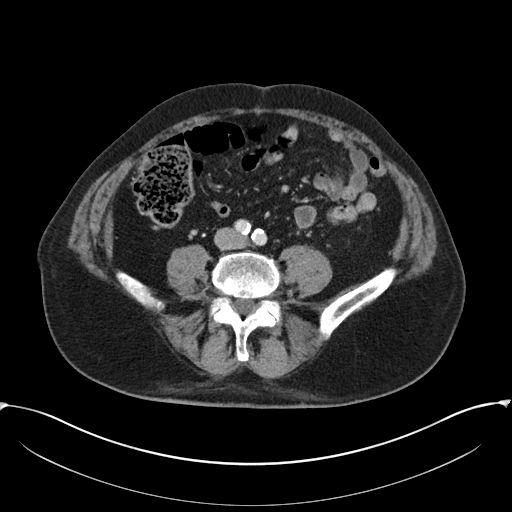
[im 58/106  soft-tissue]
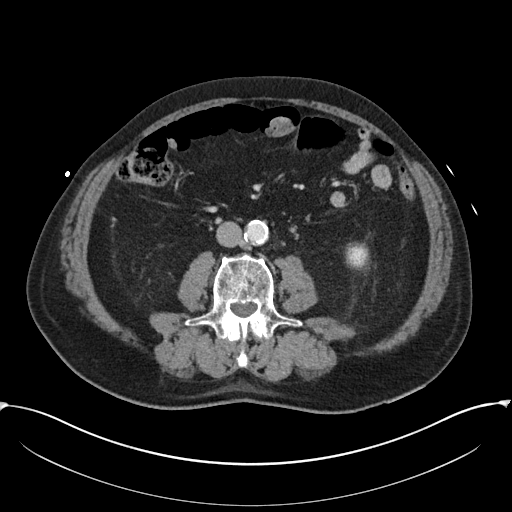
[im 64/106  soft-tissue]
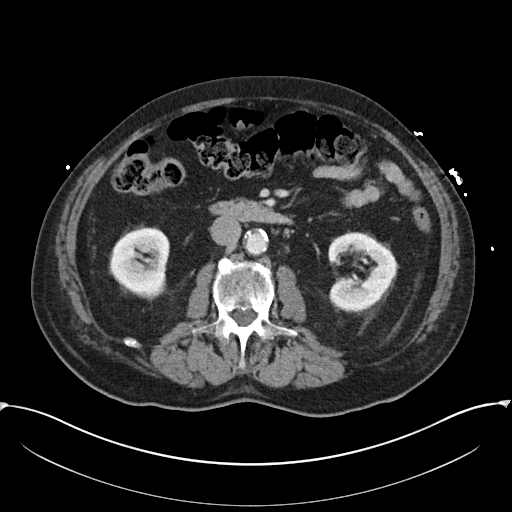
[im 64/106  bone]
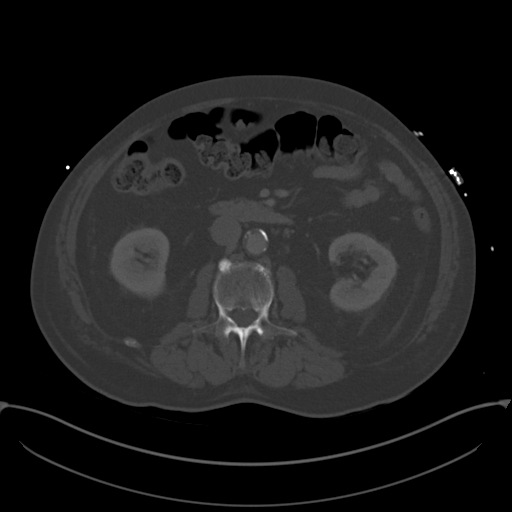
[im 69/106  soft-tissue]
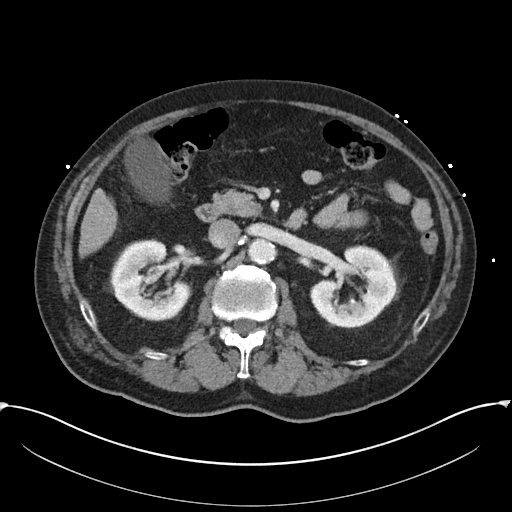
[im 79/106  soft-tissue]
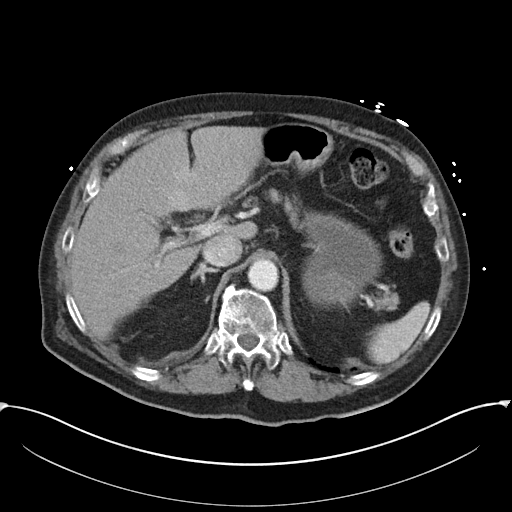
[im 85/106  soft-tissue]
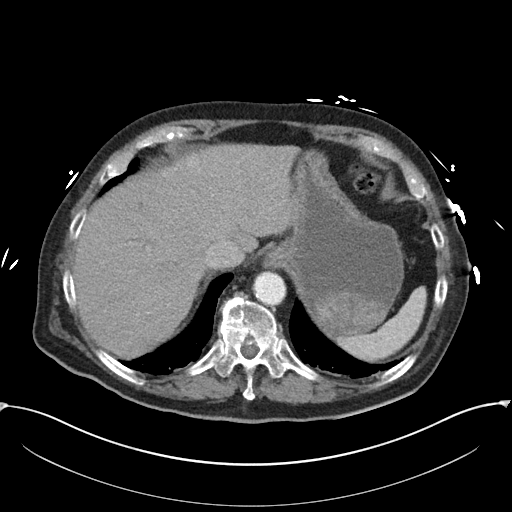
[im 90/106  soft-tissue]
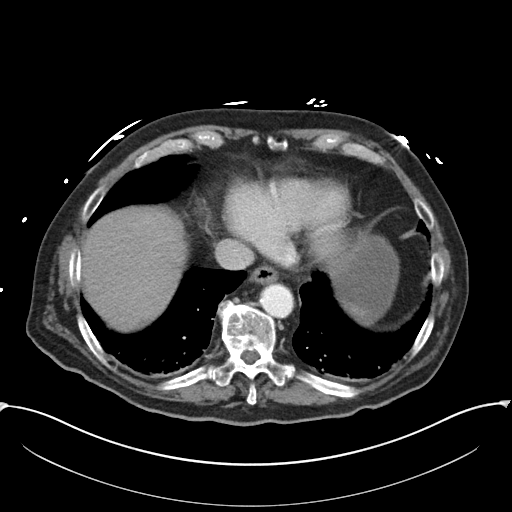
[im 100/106  soft-tissue]
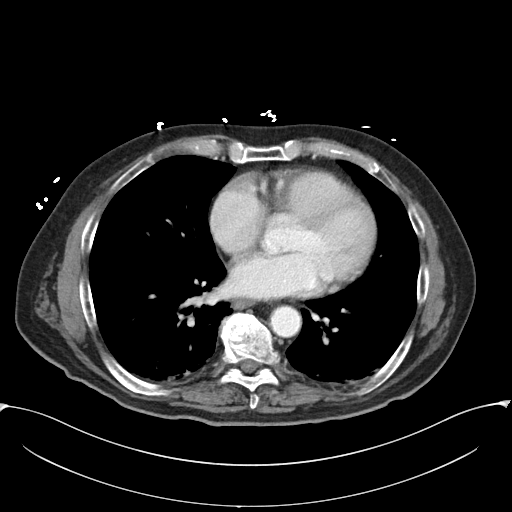

[Series 6: coronal soft tissue · coronal · 1.05mm/px · 3 of 101 slices shown]
[im 34/101  soft-tissue]
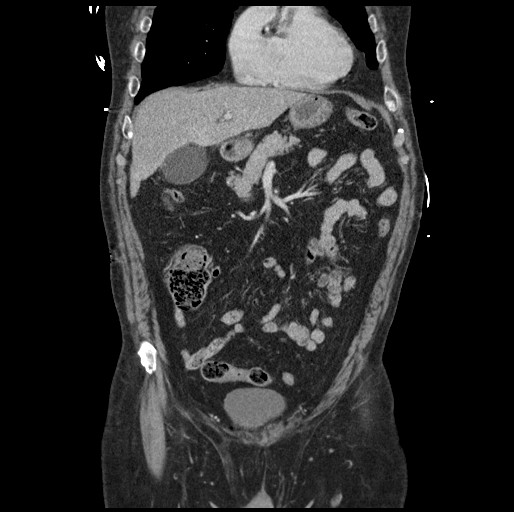
[im 45/101  soft-tissue]
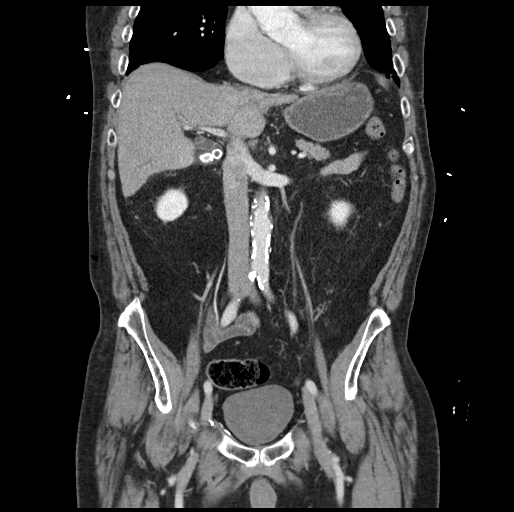
[im 56/101  soft-tissue]
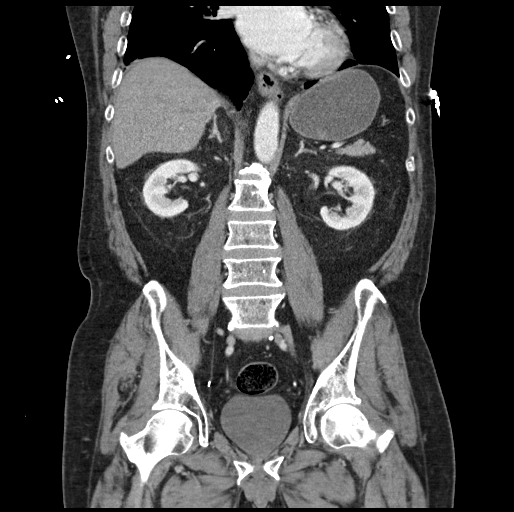

[17 of 46 positions shown; findings below may reference images not displayed]

FINDINGS: Lower chest: No acute abnormality.

Hepatobiliary: No focal liver abnormality. Cholelithiasis. No
gallbladder wall thickening or biliary dilatation.

Pancreas: Unremarkable. No pancreatic ductal dilatation or
surrounding inflammatory changes.

Spleen: Normal in size without focal abnormality.

Adrenals/Urinary Tract: Adrenal glands are unremarkable. Kidneys are
normal, without renal calculi, focal lesion, or hydronephrosis.
Bladder is unremarkable.

Stomach/Bowel: Stomach is within normal limits. Appendix appears
normal. No evidence of bowel wall thickening, distention, or
inflammatory changes.

Vascular/Lymphatic: Aortic atherosclerosis. No enlarged abdominal or
pelvic lymph nodes.

Reproductive: Prior prostatectomy.

Other: No abdominal wall hernia or abnormality. No abdominopelvic
ascites. No pneumoperitoneum.

Musculoskeletal: No acute or significant osseous findings.
IMPRESSION: 1.  No acute intra-abdominal process.
2. Unchanged cholelithiasis.
3.  Aortic atherosclerosis ([8I]-[8I]).

## 2019-05-26 MED ORDER — ONDANSETRON HCL 4 MG/2ML IJ SOLN
4.0000 mg | Freq: Once | INTRAMUSCULAR | Status: AC
  Administered 2019-05-26: 4 mg via INTRAVENOUS
  Filled 2019-05-26: qty 2

## 2019-05-26 MED ORDER — MORPHINE SULFATE (PF) 4 MG/ML IV SOLN
4.0000 mg | Freq: Once | INTRAVENOUS | Status: AC
  Administered 2019-05-26: 4 mg via INTRAVENOUS
  Filled 2019-05-26: qty 1

## 2019-05-26 MED ORDER — IOPAMIDOL (ISOVUE-300) INJECTION 61%
100.0000 mL | Freq: Once | INTRAVENOUS | Status: AC | PRN
  Administered 2019-05-26: 100 mL via INTRAVENOUS

## 2019-05-26 MED ORDER — ONDANSETRON 4 MG PO TBDP: 4.0000 mg | ORAL_TABLET | Freq: Three times a day (TID) | ORAL | 0 refills | Status: DC | PRN

## 2019-05-26 MED ORDER — SODIUM CHLORIDE 0.9 % IV BOLUS
1000.0000 mL | Freq: Once | INTRAVENOUS | Status: AC
  Administered 2019-05-26: 09:00:00 1000 mL via INTRAVENOUS

## 2019-05-26 MED ORDER — HYDROCODONE-ACETAMINOPHEN 5-325 MG PO TABS: 0.5000 | ORAL_TABLET | Freq: Four times a day (QID) | ORAL | 0 refills | Status: DC | PRN

## 2019-05-26 NOTE — ED Notes (Signed)
Pt's wife updated.

## 2019-05-26 NOTE — ED Triage Notes (Signed)
Pt arrives from home with ems with c/o of n/v and generalized abd pain. Pt states this started at 6pm last night after eating a stew. Pt states he vomited one time last night and feels nauseous since with dry heaving. No diarrhea.

## 2019-05-26 NOTE — ED Notes (Signed)
Pt transported to US

## 2019-05-26 NOTE — ED Notes (Signed)
ED Provider at bedside. 

## 2019-05-26 NOTE — Discharge Instructions (Signed)
If you develop worsening, continued, or recurrent abdominal pain, uncontrolled vomiting, fever, chest or back pain, or any other new/concerning symptoms then return to the ER for evaluation.  

## 2019-05-26 NOTE — ED Notes (Signed)
Wife called and updated- wife will come pick patient up.

## 2019-05-26 NOTE — ED Provider Notes (Signed)
[facility] EMERGENCY DEPARTMENT Provider Note   CSN: [id number] Arrival date & time: 05/26/19  6269    History   Chief Complaint Chief Complaint  Patient presents with   Emesis   Abdominal Pain    HPI Kevin Hoover is a 85 y.o. male.     HPI  85 year old male presents with right-sided abdominal pain.  Started around 6 PM last night.  He has had nausea ever since and vomited once last evening.  No diarrhea or urinary symptoms.  No back pain.  No chest pain or shortness of breath.  No fevers.  Pain is a 10/10 and feels like there is pressure in his abdomen like there is a watermelon in there.  He is not sure if his abdomen is actually swollen.  No previous symptoms similar to this.  Past Medical History:  Diagnosis Date   Glaucoma     Patient Active Problem List   Diagnosis Date Noted   Unexplained night sweats 11/03/2018   Viral syndrome 11/03/2018   Biliary sludge determined by ultrasound 11/03/2018    No past surgical history on file.      Home Medications    Prior to Admission medications   Medication Sig Start Date End Date Taking? Authorizing Provider  polyethylene glycol (MIRALAX / GLYCOLAX) packet Take 17 g by mouth daily.   Yes [provider]  timolol (TIMOPTIC) 0.5 % ophthalmic solution Place 1 drop into both eyes daily. 04/24/15  Yes [provider]  HYDROcodone-acetaminophen (NORCO) 5-325 MG tablet Take 0.5-1 tablets by mouth every 6 (six) hours as needed. 05/26/19   Sherwood Gambler, MD  ondansetron (ZOFRAN ODT) 4 MG disintegrating tablet Take 1 tablet (4 mg total) by mouth every 8 (eight) hours as needed for nausea or vomiting. 05/26/19   Sherwood Gambler, MD  VITAMIN D, CHOLECALCIFEROL, PO Take 1 tablet by mouth daily.    [provider]    Family History No family history on file.  Social History Social History   Tobacco Use   Smoking status: Never Smoker   Smokeless tobacco: Never Used    Substance Use Topics   Alcohol use: Not Currently   Drug use: Not Currently     Allergies   Atorvastatin calcium and Vardenafil   Review of Systems Review of Systems  Constitutional: Negative for fever.  Respiratory: Negative for shortness of breath.   Cardiovascular: Negative for chest pain.  Gastrointestinal: Positive for abdominal pain, nausea and vomiting. Negative for diarrhea.  Genitourinary: Negative for dysuria and hematuria.  Musculoskeletal: Negative for back pain.  All other systems reviewed and are negative.    Physical Exam Updated Vital Signs BP (!) 166/81    Pulse 74    Temp 97.8 F (36.6 C) (Oral) Comment: Simultaneous filing. User may not have seen previous data. Comment (Src): Simultaneous filing. User may not have seen previous data.   Resp 17    SpO2 96%   Physical Exam Vitals signs and nursing note reviewed.  Constitutional:      Appearance: He is well-developed. He is not ill-appearing or diaphoretic.  HENT:     Head: Normocephalic and atraumatic.     Right Ear: External ear normal.     Left Ear: External ear normal.     Nose: Nose normal.  Eyes:     General:        Right eye: No discharge.        Left eye: No discharge.  Neck:  Musculoskeletal: Neck supple.  Cardiovascular:     Rate and Rhythm: Normal rate and regular rhythm.     Heart sounds: Normal heart sounds.  Pulmonary:     Effort: Pulmonary effort is normal.     Breath sounds: Normal breath sounds.  Abdominal:     Palpations: Abdomen is soft.     Tenderness: There is abdominal tenderness in the right upper quadrant.  Skin:    General: Skin is warm and dry.  Neurological:     Mental Status: He is alert.  Psychiatric:        Mood and Affect: Mood is not anxious.      ED Treatments / Results  Labs (all labs ordered are listed, but only abnormal results are displayed) Labs Reviewed  COMPREHENSIVE METABOLIC PANEL - Abnormal; Notable for the following components:       Result Value   Glucose, Bld 157 (*)    Calcium 8.5 (*)    Total Protein 5.8 (*)    All other components within normal limits  CBC WITH DIFFERENTIAL/PLATELET - Abnormal; Notable for the following components:   Neutro Abs 8.0 (*)    All other components within normal limits  URINALYSIS, ROUTINE W REFLEX MICROSCOPIC - Abnormal; Notable for the following components:   Specific Gravity, Urine 1.042 (*)    Glucose, UA 50 (*)    Ketones, ur 20 (*)    Protein, ur 30 (*)    All other components within normal limits  LIPASE, BLOOD  TROPONIN I (HIGH SENSITIVITY)  TROPONIN I (HIGH SENSITIVITY)    EKG EKG Interpretation  Date/Time:  Friday May 26 2019 09:01:06 EDT Ventricular Rate:  83 PR Interval:    QRS Duration: 94 QT Interval:  403 QTC Calculation: 474 R Axis:   61 Text Interpretation:  Sinus rhythm Atrial premature complexes in couplets Borderline ST depression, diffuse leads No old tracing to compare Confirmed by Sherwood Gambler [id number]) on 05/26/2019 9:06:16 AM   Radiology Ct Abdomen Pelvis W Contrast  Result Date: 05/26/2019 CLINICAL DATA:  Nausea and vomiting. EXAM: CT ABDOMEN AND PELVIS WITH CONTRAST TECHNIQUE: Multidetector CT imaging of the abdomen and pelvis was performed using the standard protocol following bolus administration of intravenous contrast. CONTRAST:  173mL ISOVUE-300 IOPAMIDOL (ISOVUE-300) INJECTION 61% COMPARISON:  Right upper quadrant ultrasound from same day. CT abdomen pelvis dated September 26, 2018. FINDINGS: Lower chest: No acute abnormality. Hepatobiliary: No focal liver abnormality. Cholelithiasis. No gallbladder wall thickening or biliary dilatation. Pancreas: Unremarkable. No pancreatic ductal dilatation or surrounding inflammatory changes. Spleen: Normal in size without focal abnormality. Adrenals/Urinary Tract: Adrenal glands are unremarkable. Kidneys are normal, without renal calculi, focal lesion, or hydronephrosis. Bladder is unremarkable. Stomach/Bowel:  Stomach is within normal limits. Appendix appears normal. No evidence of bowel wall thickening, distention, or inflammatory changes. Vascular/Lymphatic: Aortic atherosclerosis. No enlarged abdominal or pelvic lymph nodes. Reproductive: Prior prostatectomy. Other: No abdominal wall hernia or abnormality. No abdominopelvic ascites. No pneumoperitoneum. Musculoskeletal: No acute or significant osseous findings. IMPRESSION: 1.  No acute intra-abdominal process. 2. Unchanged cholelithiasis. 3.  Aortic atherosclerosis (ICD10-I70.0). Electronically Signed   By: Titus Dubin M.D.   On: 05/26/2019 13:56   US Abdomen Limited Ruq  Result Date: 05/26/2019 CLINICAL DATA:  Pain EXAM: ULTRASOUND ABDOMEN LIMITED RIGHT UPPER QUADRANT COMPARISON:  10/07/2018 FINDINGS: Gallbladder: Cholelithiasis measured up to 1.6 cm. Gallbladder wall thickness upper limits normal 2.5 mm. No pericholecystic fluid. Sonographer describes no sonographic Murphy's sign. Common bile duct: Diameter: 5.2 mm, unremarkable Liver: No  focal lesion identified. Within normal limits in parenchymal echogenicity. Portal vein is patent on color Doppler imaging with normal direction of blood flow towards the liver. IMPRESSION: Cholelithiasis Electronically Signed   By: Lucrezia Europe M.D.   On: 05/26/2019 11:22    Procedures Procedures (including critical care time)  Medications Ordered in ED Medications  morphine 4 MG/ML injection 4 mg (4 mg Intravenous Given 05/26/19 0859)  ondansetron (ZOFRAN) injection 4 mg (4 mg Intravenous Given 05/26/19 0859)  sodium chloride 0.9 % bolus 1,000 mL (0 mLs Intravenous Stopped 05/26/19 1445)  morphine 4 MG/ML injection 4 mg (4 mg Intravenous Given 05/26/19 0936)  iopamidol (ISOVUE-300) 61 % injection 100 mL (100 mLs Intravenous Contrast Given 05/26/19 1340)     Initial Impression / Assessment and Plan / ED Course  I have reviewed the triage vital signs and the nursing notes.  Pertinent labs & imaging results that  were available during my care of the patient were reviewed by me and considered in my medical decision making (see chart for details).        Patient's ultrasound shows continued gallstone but no evidence of cholecystitis.  His WBC is negative.  LFTs are benign.  Given continued discomfort, CT abdomen pelvis obtained and shows no other new pathology.  He has not had any vomiting since initial arrival in the ED.  He feels well enough to want to go home.  I think given that his pain seems to be controlled, no vomiting in the ED, and ability to tolerate oral fluids and crackers, it would be reasonable for him to go home.  Given his age and vague abdominal pain with vomiting, troponin was obtained but both of these are negative.  At this point, probably this is from the gallbladder but not indicative of cholecystitis.  I discussed strict return precautions and he agrees with this and would like to go home.  He feels it could be related to something he ate last night given that he ate something that was made 10 days ago.  This is certainly possible but nevertheless we discussed he should have a low threshold to return.  Final Clinical Impressions(s) / ED Diagnoses   Final diagnoses:  Right sided abdominal pain  Calculus of gallbladder without cholecystitis without obstruction    ED Discharge Orders         Ordered    HYDROcodone-acetaminophen (NORCO) 5-325 MG tablet  Every 6 hours PRN     05/26/19 1447    ondansetron (ZOFRAN ODT) 4 MG disintegrating tablet  Every 8 hours PRN     05/26/19 1447           Sherwood Gambler, MD 05/26/19 1519

## 2019-06-12 ENCOUNTER — Other Ambulatory Visit: Payer: Self-pay | Admitting: Surgery

## 2019-06-12 DIAGNOSIS — K8020 Calculus of gallbladder without cholecystitis without obstruction: Secondary | ICD-10-CM | POA: Diagnosis not present

## 2019-06-30 ENCOUNTER — Ambulatory Visit: Payer: Medicare Other | Admitting: Neurology

## 2019-07-11 DIAGNOSIS — H5203 Hypermetropia, bilateral: Secondary | ICD-10-CM | POA: Diagnosis not present

## 2019-07-11 DIAGNOSIS — H401131 Primary open-angle glaucoma, bilateral, mild stage: Secondary | ICD-10-CM | POA: Diagnosis not present

## 2019-07-17 ENCOUNTER — Encounter (HOSPITAL_COMMUNITY)
Admission: RE | Admit: 2019-07-17 | Discharge: 2019-07-17 | Disposition: A | Discharge status: Home / Self Care | Payer: Medicare Other | Source: Ambulatory Visit | Attending: Surgery | Admitting: Surgery

## 2019-07-17 ENCOUNTER — Other Ambulatory Visit (HOSPITAL_COMMUNITY)
Admission: RE | Admit: 2019-07-17 | Discharge: 2019-07-17 | Disposition: A | Discharge status: Home / Self Care | Payer: Medicare Other | Source: Ambulatory Visit | Attending: Surgery | Admitting: Surgery

## 2019-07-17 ENCOUNTER — Other Ambulatory Visit: Payer: Self-pay

## 2019-07-17 ENCOUNTER — Encounter (HOSPITAL_COMMUNITY): Payer: Self-pay

## 2019-07-17 DIAGNOSIS — Z20828 Contact with and (suspected) exposure to other viral communicable diseases: Secondary | ICD-10-CM | POA: Diagnosis not present

## 2019-07-17 DIAGNOSIS — Z01812 Encounter for preprocedural laboratory examination: Secondary | ICD-10-CM | POA: Insufficient documentation

## 2019-07-17 HISTORY — DX: Other specified postprocedural states: R11.2

## 2019-07-17 HISTORY — DX: Malignant (primary) neoplasm, unspecified: C80.1

## 2019-07-17 HISTORY — DX: Unspecified osteoarthritis, unspecified site: M19.90

## 2019-07-17 HISTORY — DX: Other complications of anesthesia, initial encounter: T88.59XA

## 2019-07-17 HISTORY — DX: Other specified postprocedural states: Z98.890

## 2019-07-17 LAB — CBC
HCT: 46.2 % (ref 39.0–52.0)
Hemoglobin: 15.3 g/dL (ref 13.0–17.0)
MCH: 31.1 pg (ref 26.0–34.0)
MCHC: 33.1 g/dL (ref 30.0–36.0)
MCV: 93.9 fL (ref 80.0–100.0)
Platelets: 228 10*3/uL (ref 150–400)
RBC: 4.92 MIL/uL (ref 4.22–5.81)
RDW: 12.9 % (ref 11.5–15.5)
WBC: 5.7 10*3/uL (ref 4.0–10.5)
nRBC: 0 % (ref 0.0–0.2)

## 2019-07-17 LAB — SARS CORONAVIRUS 2 (TAT 6-24 HRS): SARS Coronavirus 2: NEGATIVE

## 2019-07-17 NOTE — Pre-Procedure Instructions (Signed)
Kevin Hoover  07/17/2019      [id number] [address], [place] [address] AT [facility] [address] [place] [address] Phone: [phone] Fax: [phone]  [facility] [place], [address] [place] [address] Phone: [phone] Fax: [phone]    Your procedure is scheduled on July 20, 2019.  Report to [facility] Admitting at 830 AM.  Call this number if you have problems the morning of surgery:  [phone]   Remember:  Do not eat after midnight.  You may drink clear liquids until 730 AM the morning of surgery.  Clear liquids allowed are: Water, Juice (non-citric and without pulp), Clear Tea, Black Coffee only and Gatorade    Take these medicines the morning of surgery with A SIP OF WATER  Timolol eyedrops  Beginning now,  STOP taking any Aspirin (unless otherwise instructed by your surgeon), Aleve, Naproxen, Ibuprofen, Motrin, Advil, Goody's, BC's, all herbal medications, fish oil, and all vitamins    Day of surgery:  Do not wear jewelry  Do not wear lotions, powders, or colognes, or deodorant.  Men may shave face and neck.  Do not bring valuables to the hospital.  [facility] is not responsible for any belongings or valuables.  Contacts, dentures or bridgework may not be worn into surgery.  Leave your suitcase in the car.  After surgery it may be brought to your room.  For patients admitted to the hospital, discharge time will be determined by your treatment team.  Patients discharged the day of surgery will not be allowed to drive home.    [facility]- Preparing For Surgery  Before surgery, you can play an important role. Because skin is not sterile, your skin needs to be as free of germs as possible. You can reduce the number of germs on your skin by washing with CHG (chlorahexidine gluconate) Soap before surgery.  CHG is an  antiseptic cleaner which kills germs and bonds with the skin to continue killing germs even after washing.    Oral Hygiene is also important to reduce your risk of infection.  Remember - BRUSH YOUR TEETH THE MORNING OF SURGERY WITH YOUR REGULAR TOOTHPASTE  Please do not use if you have an allergy to CHG or antibacterial soaps. If your skin becomes reddened/irritated stop using the CHG.  Do not shave (including legs and underarms) for at least 48 hours prior to first CHG shower. It is OK to shave your face.  Please follow these instructions carefully.   1. Shower the NIGHT BEFORE SURGERY and the MORNING OF SURGERY with CHG.   2. If you chose to wash your hair, wash your hair first as usual with your normal shampoo.  3. After you shampoo, rinse your hair and body thoroughly to remove the shampoo.  4. Use CHG as you would any other liquid soap. You can apply CHG directly to the skin and wash gently with a scrungie or a clean washcloth.   5. Apply the CHG Soap to your body ONLY FROM THE NECK DOWN.  Do not use on open wounds or open sores. Avoid contact with your eyes, ears, mouth and genitals (private parts). Wash Face and genitals (private parts)  with your normal soap.  6. Wash thoroughly, paying special attention to the area where your surgery will be performed.  7. Thoroughly  rinse your body with warm water from the neck down.  8. DO NOT shower/wash with your normal soap after using and rinsing off the CHG Soap.  9. Pat yourself dry with a CLEAN TOWEL.  10. Wear CLEAN PAJAMAS to bed the night before surgery, wear comfortable clothes the morning of surgery  11. Place CLEAN SHEETS on your bed the night of your first shower and DO NOT SLEEP WITH PETS.  Day of Surgery: Shower as above Do not apply any deodorants/lotions.  Please wear clean clothes to the hospital/surgery center.   Remember to brush your teeth WITH YOUR REGULAR TOOTHPASTE.  Please read over the following fact sheets  that you were given.

## 2019-07-17 NOTE — Progress Notes (Addendum)
PCP -  Crist Infante, MD Cardiologist - pt denies  Chest x-ray - pt denies EKG -  05/26/2019 in EPIC  Stress Test - 2013 in Care Everywhere ECHO - pt denies  Cardiac Cath - pt denies  Sleep Study - pt denies CPAP - n/a  Fasting Blood Sugar - n/a Checks Blood Sugar _____ times a day- n/a  Blood Thinner Instructions: n/a Aspirin Instructions: n/a  Anesthesia review: Yes- EKG  Patient denies shortness of breath, fever, cough and chest pain at PAT appointment  Patient verbalized understanding of instructions that were given to them at the PAT appointment. Patient was also instructed that they will need to review over the PAT instructions again at home before surgery.   Coronavirus Screening  Have you experienced the following symptoms:  Cough yes/no: No Fever (>100.1F)  yes/no: No Runny nose yes/no: No Sore throat yes/no: No Difficulty breathing/shortness of breath  yes/no: No  Have you or a family member traveled in the last 14 days and where? yes/no: No   If the patient indicates "YES" to the above questions, their PAT will be rescheduled to limit the exposure to others and, the surgeon will be notified. THE PATIENT WILL NEED TO BE ASYMPTOMATIC FOR 14 DAYS.   If the patient is not experiencing any of these symptoms, the PAT nurse will instruct them to NOT bring anyone with them to their appointment since they may have these symptoms or traveled as well.   Please remind your patients and families that hospital visitation restrictions are in effect and the importance of the restrictions.

## 2019-07-18 ENCOUNTER — Encounter (HOSPITAL_COMMUNITY): Payer: Self-pay | Admitting: Physician Assistant

## 2019-07-19 NOTE — Anesthesia Preprocedure Evaluation (Deleted)
Anesthesia Evaluation Anesthesia Physical Anesthesia Plan  ASA:   Anesthesia Plan:    Post-op Pain Management:    Induction:   PONV Risk Score and Plan:   Airway Management Planned:   Additional Equipment:   Intra-op Plan:   Post-operative Plan:   Informed Consent:   Plan Discussed with:   Anesthesia Plan Comments: (Abn EKG with diffuse ST depression noted at PAT. No prior tracing for comparison. Tracing faxed to PCP Dr. Joylene Draft for input. Received callback from Dr. Joylene Draft and discussed pt history (low baseline activity, reduced exercise tolerance) and EKG findings. He felt pt should have cardiology eval prior to surgery and said he would try to get him seen in the next several days. Dr. Ninfa Linden notified. )        Anesthesia Quick Evaluation

## 2019-07-20 ENCOUNTER — Encounter (HOSPITAL_COMMUNITY): Admission: RE | Payer: Self-pay | Source: Home / Self Care

## 2019-07-20 ENCOUNTER — Ambulatory Visit (HOSPITAL_COMMUNITY): Admission: RE | Admit: 2019-07-20 | Payer: Medicare Other | Source: Home / Self Care | Admitting: Surgery

## 2019-07-20 SURGERY — LAPAROSCOPIC CHOLECYSTECTOMY
Anesthesia: General

## 2019-07-21 ENCOUNTER — Encounter: Payer: Self-pay | Admitting: Cardiology

## 2019-07-24 ENCOUNTER — Encounter: Payer: Self-pay | Admitting: Cardiology

## 2019-07-24 ENCOUNTER — Ambulatory Visit (INDEPENDENT_AMBULATORY_CARE_PROVIDER_SITE_OTHER): Payer: Medicare Other | Admitting: Cardiology

## 2019-07-24 VITALS — BP 142/67 | HR 77 | Ht 70.0 in | Wt 185.2 lb

## 2019-07-24 DIAGNOSIS — E785 Hyperlipidemia, unspecified: Secondary | ICD-10-CM | POA: Insufficient documentation

## 2019-07-24 DIAGNOSIS — Z01810 Encounter for preprocedural cardiovascular examination: Secondary | ICD-10-CM | POA: Diagnosis not present

## 2019-07-24 DIAGNOSIS — C61 Malignant neoplasm of prostate: Secondary | ICD-10-CM | POA: Insufficient documentation

## 2019-07-24 DIAGNOSIS — C801 Malignant (primary) neoplasm, unspecified: Secondary | ICD-10-CM | POA: Insufficient documentation

## 2019-07-24 DIAGNOSIS — N529 Male erectile dysfunction, unspecified: Secondary | ICD-10-CM | POA: Insufficient documentation

## 2019-07-24 NOTE — Progress Notes (Signed)
Patient referred by Crist Infante, MD for Pre-op evaluation  Subjective:   Kevin Hoover, male    DOB: 1933/04/18, 85 y.o.   MRN: [id number]   Chief Complaint  Patient presents with  . Other    pre op clearance for gallbladder   . New Patient (Initial Visit)    HPI  85 y.o. Caucasian male with no significant ongoing medical comorbidities, referred for evaluation of preop cardiac stratification.  Has recently been diagnosed with cholelithiasis and he was recommended to undergo cholecystectomy.  Patient was originally going to have the surgery earlier this month.  However, his resting EKG showed nonspecific ST depression changes.  Given his low functional capacity, he was recommended to undergo cardiac evaluation.  Patient lives with his wife in [place].  He is to be active playing golf until a year ago.  In follow-up 2020, he had viral illness following which, his functional capacity is reduced with generalized weakness.  He denies any chest pain, shortness of breath, orthopnea, leg edema. He does not do any regular physical activity at this time.  His overall functional capacity is low.  Past Medical History:  Diagnosis Date  . Arthritis   . Cancer [facility])    prostate cancer  . Complication of anesthesia   . Glaucoma   . PONV (postoperative nausea and vomiting)      Past Surgical History:  Procedure Laterality Date  . HEMORRHOID SURGERY  1980s  . PROSTATE SURGERY  1999     Social History   Socioeconomic History  . Marital status: Married    Spouse name: Not on file  . Number of children: 2  . Years of education: Not on file  . Highest education level: Not on file  Occupational History  . Not on file  Social Needs  . Financial resource strain: Not on file  . Food insecurity    Worry: Not on file    Inability: Not on file  . Transportation needs    Medical: Not on file    Non-medical: Not on file  Tobacco Use  . Smoking status: Never Smoker  . Smokeless  tobacco: Never Used  Substance and Sexual Activity  . Alcohol use: Not Currently  . Drug use: Not Currently  . Sexual activity: Not Currently  Lifestyle  . Physical activity    Days per week: Not on file    Minutes per session: Not on file  . Stress: Not on file  Relationships  . Social Herbalist on phone: Not on file    Gets together: Not on file    Attends religious service: Not on file    Active member of club or organization: Not on file    Attends meetings of clubs or organizations: Not on file    Relationship status: Not on file  . Intimate partner violence    Fear of current or ex partner: Not on file    Emotionally abused: Not on file    Physically abused: Not on file    Forced sexual activity: Not on file  Other Topics Concern  . Not on file  Social History Narrative  . Not on file     Family History  Problem Relation Age of Onset  . Breast cancer Mother   . Kidney disease Father   . Parkinson's disease Brother      Current Outpatient Medications on File Prior to Visit  Medication Sig Dispense Refill  . polyethylene glycol (MIRALAX /  GLYCOLAX) packet Take 17 g by mouth daily as needed (constipation.).     Marland Kitchen timolol (TIMOPTIC) 0.5 % ophthalmic solution Place 1 drop into both eyes daily.     No current facility-administered medications on file prior to visit.     Cardiovascular studies:  EKG 07/24/2019: Sinus rhythm 71 bpm. Frequent PAC's.  RUQ [place] 05/26/2019: Cholelithiasis  CT abdomen 05/26/2019: 1.  No acute intra-abdominal process. 2. Unchanged cholelithiasis. 3.  Aortic atherosclerosis (ICD10-I70.0).  Recent labs: Results for ALEJANDRA, BARNA "MAC" (MRN [id number]) as of 07/24/2019 08:11  Ref. Range 05/26/2019 08:53 05/26/2019 08:57 05/26/2019 11:08  COMPREHENSIVE METABOLIC PANEL Unknown Rpt (A)    Sodium Latest Ref Range: 135 - 145 mmol/L 136    Potassium Latest Ref Range: 3.5 - 5.1 mmol/L 3.7    Chloride Latest Ref Range: 98 - 111  mmol/L 105    CO2 Latest Ref Range: 22 - 32 mmol/L 22    Glucose Latest Ref Range: 70 - 99 mg/dL 157 (H)    BUN Latest Ref Range: 8 - 23 mg/dL 16    Creatinine Latest Ref Range: 0.61 - 1.24 mg/dL 0.81    Calcium Latest Ref Range: 8.9 - 10.3 mg/dL 8.5 (L)    Anion gap Latest Ref Range: 5 - 15  9    Alkaline Phosphatase Latest Ref Range: 38 - 126 U/L 64    Albumin Latest Ref Range: 3.5 - 5.0 g/dL 3.5    Lipase Latest Ref Range: 11 - 51 U/L 33    AST Latest Ref Range: 15 - 41 U/L 26    ALT Latest Ref Range: 0 - 44 U/L 21    Total Protein Latest Ref Range: 6.5 - 8.1 g/dL 5.8 (L)    Total Bilirubin Latest Ref Range: 0.3 - 1.2 mg/dL 1.0    GFR, Est Non African American Latest Ref Range: >60 mL/min >60    GFR, Est African American Latest Ref Range: >60 mL/min >60    Troponin I (High Sensitivity) Latest Ref Range: <18 ng/L  7 12   Results for Jafri, Knoah "MAC" (MRN [id number]) as of 07/24/2019 08:11  Ref. Range 07/17/2019 14:11  WBC Latest Ref Range: 4.0 - 10.5 K/uL 5.7  RBC Latest Ref Range: 4.22 - 5.81 MIL/uL 4.92  Hemoglobin Latest Ref Range: 13.0 - 17.0 g/dL 15.3  HCT Latest Ref Range: 39.0 - 52.0 % 46.2  MCV Latest Ref Range: 80.0 - 100.0 fL 93.9  MCH Latest Ref Range: 26.0 - 34.0 pg 31.1  MCHC Latest Ref Range: 30.0 - 36.0 g/dL 33.1  RDW Latest Ref Range: 11.5 - 15.5 % 12.9  Platelets Latest Ref Range: 150 - 400 K/uL 228  nRBC Latest Ref Range: 0.0 - 0.2 % 0.0    Review of Systems  Constitution: Negative for decreased appetite, malaise/fatigue, weight gain and weight loss.  HENT: Negative for congestion.   Eyes: Negative for visual disturbance.  Cardiovascular: Negative for chest pain, dyspnea on exertion, leg swelling, palpitations and syncope.  Respiratory: Negative for cough.   Endocrine: Negative for cold intolerance.  Hematologic/Lymphatic: Does not bruise/bleed easily.  Skin: Negative for itching and rash.  Musculoskeletal: Negative for myalgias.  Gastrointestinal:  Positive for abdominal pain (RUQ  Intermittent). Negative for nausea and vomiting.  Genitourinary: Negative for dysuria.  Neurological: Negative for dizziness and weakness.  Psychiatric/Behavioral: The patient is not nervous/anxious.   All other systems reviewed and are negative.        Vitals:   07/24/19 9629  BP: (!) 142/67  Pulse: 77  SpO2: 99%     Body mass index is 26.57 kg/m. Filed Weights   07/24/19 0821  Weight: 185 lb 3.2 oz (84 kg)     Objective:   Physical Exam  Constitutional: He is oriented to person, place, and time. He appears well-developed and well-nourished. No distress.  HENT:  Head: Normocephalic and atraumatic.  Eyes: Pupils are equal, round, and reactive to light. Conjunctivae are normal.  Neck: No JVD present.  Cardiovascular: Normal rate, regular rhythm and intact distal pulses.  Pulmonary/Chest: Effort normal and breath sounds normal. He has no wheezes. He has no rales.  Abdominal: Soft. Bowel sounds are normal. There is no rebound.  Musculoskeletal:        General: No edema.  Lymphadenopathy:    He has no cervical adenopathy.  Neurological: He is alert and oriented to person, place, and time. No cranial nerve deficit.  Skin: Skin is warm and dry.  Psychiatric: He has a normal mood and affect.  Nursing note and vitals reviewed.         Assessment & Recommendations:   85 y.o. Caucasian male with no significant ongoing medical comorbidities, referred for evaluation of preop cardiac stratification.  Resting EKG shows occasional PAC, no prominent ST depression seen today.  Given his low baseline functional capacity, and advanced age, recommend Lexiscan nuclear stress test for cardiac stratification.  If low to intermediate risk stress test, it would be reasonable to proceed with cholecystectomy with low perioperative cardiac risk.  He is normotensive.  Okay to not start any antihypertensive, or beta-blocker therapy.  Given his advanced age  and no known cardiovascular disease, reasonable not to start on lipid-lowering therapy.  I will complete his cardiac recertification after the stress test. I will see him on as-needed basis.  Thank you for referring the patient to [place]. Please feel free to contact with any questions.  Nigel Mormon, MD [facility] Pager: [phone] Office: [phone] If no answer Cell [phone]

## 2019-07-28 NOTE — Telephone Encounter (Signed)
Please read

## 2019-08-09 ENCOUNTER — Encounter: Payer: Self-pay | Admitting: Cardiology

## 2019-08-09 ENCOUNTER — Other Ambulatory Visit: Payer: Self-pay

## 2019-08-09 ENCOUNTER — Ambulatory Visit (INDEPENDENT_AMBULATORY_CARE_PROVIDER_SITE_OTHER): Payer: Medicare Other

## 2019-08-09 DIAGNOSIS — Z01810 Encounter for preprocedural cardiovascular examination: Secondary | ICD-10-CM

## 2019-08-09 NOTE — Progress Notes (Signed)
No significant abnormalities noted on stress test. Okay to proceed with gall bladder surgery.

## 2019-08-10 NOTE — Progress Notes (Signed)
Pt aware.

## 2019-08-26 ENCOUNTER — Other Ambulatory Visit (HOSPITAL_COMMUNITY)
Admission: RE | Admit: 2019-08-26 | Discharge: 2019-08-26 | Disposition: A | Discharge status: Home / Self Care | Payer: Medicare Other | Source: Ambulatory Visit | Attending: Surgery | Admitting: Surgery

## 2019-08-26 DIAGNOSIS — Z20828 Contact with and (suspected) exposure to other viral communicable diseases: Secondary | ICD-10-CM | POA: Diagnosis not present

## 2019-08-26 DIAGNOSIS — Z01812 Encounter for preprocedural laboratory examination: Secondary | ICD-10-CM | POA: Diagnosis not present

## 2019-08-27 LAB — NOVEL CORONAVIRUS, NAA (HOSP ORDER, SEND-OUT TO REF LAB; TAT 18-24 HRS): SARS-CoV-2, NAA: NOT DETECTED

## 2019-08-29 ENCOUNTER — Encounter (HOSPITAL_COMMUNITY): Payer: Self-pay | Admitting: *Deleted

## 2019-08-29 ENCOUNTER — Other Ambulatory Visit: Payer: Self-pay

## 2019-08-29 NOTE — H&P (Signed)
   [place]  Location: [facility] Patient #: [id number] DOB: 01-18-1933 Married / Language: English / Race: White Male   History of Present Illness  The patient is a 85 year old male who presents for evaluation of gall stones. This is a gentleman who I saw last November with gallstones. At that time, he was asymptomatic. He recently developed right upper quadrant abdominal pain with nausea after fatty meal. He had go to the emergency room secondary to the discomfort. He did not have emesis. He has since resolved with pain. His labs at that time were normal including normal LFTs. His ultrasound and CAT scan only showed gallstones with no other abnormalities.  Past Medical History:  Diagnosis Date  . Arthritis   . Cancer [facility])    prostate cancer  . Complication of anesthesia   . Glaucoma   . PONV (postoperative nausea and vomiting)           Past Surgical History:  Procedure Laterality Date  . HEMORRHOID SURGERY  1980s  . PROSTATE SURGERY  1999     Allergies No Known Drug Allergies :  Medication History  Rosuvastatin Calcium (10MG  Tablet, Oral) Active. Timolol Maleate (0.5% Solution, Ophthalmic) Active. Aspirin (81MG  Tablet, Oral) Active. Cefdinir (300MG  Capsule, Oral) Active. Medications Reconciled  Vitals  Weight: 187.2 lb Height: 70in Body Surface Area: 2.03 m Body Mass Index: 26.86 kg/m  Pulse: 75 (Regular)  BP: 136/72(Sitting, Left Arm, Standard)     Physical Exam The physical exam findings are as follows: Note: He appears well today. His abdomen is soft. There is some questionable guarding in the right upper quadrant. Lungs clear CV RRR Skin without rashes Generally well in appearance    Assessment & Plan   SYMPTOMATIC CHOLELITHIASIS (K80.20) Impression: I do believe he had a attack of biliary colic this time and recommended he proceed with a laparoscopic cholecystectomy. He agrees and is eager to  proceed. We again discussed the surgical procedure in detail. I discussed the risk of surgery which includes but is not limited to bleeding surrounding structures, the need to convert to an open procedure, cardiopulmonary issues, preoperative testing, etc. He understands and agrees to proceed. Surgery will be scheduled.

## 2019-08-29 NOTE — Progress Notes (Signed)
Kevin Hoover denies chest pain or shortness of breath. Patient was tested for Covid and has been in quarantine since that time.

## 2019-08-30 ENCOUNTER — Ambulatory Visit (HOSPITAL_COMMUNITY): Payer: Medicare Other | Admitting: Anesthesiology

## 2019-08-30 ENCOUNTER — Encounter (HOSPITAL_COMMUNITY)
Admission: RE | Disposition: A | Discharge status: Home / Self Care | Payer: Self-pay | Source: Home / Self Care | Attending: Surgery

## 2019-08-30 ENCOUNTER — Ambulatory Visit (HOSPITAL_COMMUNITY)
Admission: RE | Admit: 2019-08-30 | Discharge: 2019-08-30 | Disposition: A | Discharge status: Home / Self Care | Payer: Medicare Other | Attending: Surgery | Admitting: Surgery

## 2019-08-30 ENCOUNTER — Encounter (HOSPITAL_COMMUNITY): Payer: Self-pay

## 2019-08-30 ENCOUNTER — Other Ambulatory Visit: Payer: Self-pay

## 2019-08-30 DIAGNOSIS — H409 Unspecified glaucoma: Secondary | ICD-10-CM | POA: Insufficient documentation

## 2019-08-30 DIAGNOSIS — Z79899 Other long term (current) drug therapy: Secondary | ICD-10-CM | POA: Diagnosis not present

## 2019-08-30 DIAGNOSIS — K8010 Calculus of gallbladder with chronic cholecystitis without obstruction: Secondary | ICD-10-CM | POA: Insufficient documentation

## 2019-08-30 DIAGNOSIS — E785 Hyperlipidemia, unspecified: Secondary | ICD-10-CM | POA: Diagnosis not present

## 2019-08-30 DIAGNOSIS — M1990 Unspecified osteoarthritis, unspecified site: Secondary | ICD-10-CM | POA: Insufficient documentation

## 2019-08-30 DIAGNOSIS — Z8546 Personal history of malignant neoplasm of prostate: Secondary | ICD-10-CM | POA: Diagnosis not present

## 2019-08-30 DIAGNOSIS — K8020 Calculus of gallbladder without cholecystitis without obstruction: Secondary | ICD-10-CM | POA: Diagnosis present

## 2019-08-30 DIAGNOSIS — Z7982 Long term (current) use of aspirin: Secondary | ICD-10-CM | POA: Insufficient documentation

## 2019-08-30 HISTORY — PX: CHOLECYSTECTOMY: SHX55

## 2019-08-30 LAB — HEMOGLOBIN: Hemoglobin: 15.9 g/dL (ref 13.0–17.0)

## 2019-08-30 SURGERY — LAPAROSCOPIC CHOLECYSTECTOMY
Anesthesia: General

## 2019-08-30 MED ORDER — CEFAZOLIN SODIUM-DEXTROSE 2-4 GM/100ML-% IV SOLN
2.0000 g | INTRAVENOUS | Status: AC
  Administered 2019-08-30: 2 g via INTRAVENOUS

## 2019-08-30 MED ORDER — LIDOCAINE 2% (20 MG/ML) 5 ML SYRINGE
INTRAMUSCULAR | Status: DC | PRN
  Administered 2019-08-30: 40 mg via INTRAVENOUS

## 2019-08-30 MED ORDER — ONDANSETRON HCL 4 MG/2ML IJ SOLN
INTRAMUSCULAR | Status: AC
  Filled 2019-08-30: qty 2

## 2019-08-30 MED ORDER — FENTANYL CITRATE (PF) 250 MCG/5ML IJ SOLN
INTRAMUSCULAR | Status: AC
  Filled 2019-08-30: qty 5

## 2019-08-30 MED ORDER — ACETAMINOPHEN 500 MG PO TABS
1000.0000 mg | ORAL_TABLET | ORAL | Status: AC
  Administered 2019-08-30: 11:00:00 1000 mg via ORAL

## 2019-08-30 MED ORDER — PROPOFOL 10 MG/ML IV BOLUS
INTRAVENOUS | Status: AC
  Filled 2019-08-30: qty 20

## 2019-08-30 MED ORDER — TRAMADOL HCL 50 MG PO TABS
ORAL_TABLET | ORAL | Status: AC
  Filled 2019-08-30: qty 1

## 2019-08-30 MED ORDER — FENTANYL CITRATE (PF) 100 MCG/2ML IJ SOLN
25.0000 ug | INTRAMUSCULAR | Status: DC | PRN
  Administered 2019-08-30 (×2): 50 ug via INTRAVENOUS

## 2019-08-30 MED ORDER — ACETAMINOPHEN 500 MG PO TABS
ORAL_TABLET | ORAL | Status: AC
  Administered 2019-08-30: 11:00:00 1000 mg via ORAL
  Filled 2019-08-30: qty 2

## 2019-08-30 MED ORDER — SUGAMMADEX SODIUM 200 MG/2ML IV SOLN
INTRAVENOUS | Status: DC | PRN
  Administered 2019-08-30: 330 mg via INTRAVENOUS

## 2019-08-30 MED ORDER — TRAMADOL HCL 50 MG PO TABS
50.0000 mg | ORAL_TABLET | Freq: Four times a day (QID) | ORAL | Status: DC | PRN
  Administered 2019-08-30: 50 mg via ORAL

## 2019-08-30 MED ORDER — CHLORHEXIDINE GLUCONATE CLOTH 2 % EX PADS: 6.0000 | MEDICATED_PAD | Freq: Once | CUTANEOUS | Status: DC

## 2019-08-30 MED ORDER — DEXAMETHASONE SODIUM PHOSPHATE 10 MG/ML IJ SOLN
INTRAMUSCULAR | Status: DC | PRN
  Administered 2019-08-30: 4 mg via INTRAVENOUS

## 2019-08-30 MED ORDER — TRAMADOL HCL 50 MG PO TABS: 50.0000 mg | ORAL_TABLET | Freq: Four times a day (QID) | ORAL | 0 refills | Status: DC | PRN

## 2019-08-30 MED ORDER — CEFAZOLIN SODIUM-DEXTROSE 2-4 GM/100ML-% IV SOLN
INTRAVENOUS | Status: AC
  Filled 2019-08-30: qty 100

## 2019-08-30 MED ORDER — ROCURONIUM BROMIDE 10 MG/ML (PF) SYRINGE
PREFILLED_SYRINGE | INTRAVENOUS | Status: DC | PRN
  Administered 2019-08-30: 80 mg via INTRAVENOUS

## 2019-08-30 MED ORDER — SODIUM CHLORIDE 0.9 % IR SOLN
Status: DC | PRN
  Administered 2019-08-30: 1000 mL

## 2019-08-30 MED ORDER — ROCURONIUM BROMIDE 10 MG/ML (PF) SYRINGE
PREFILLED_SYRINGE | INTRAVENOUS | Status: AC
  Filled 2019-08-30: qty 20

## 2019-08-30 MED ORDER — FENTANYL CITRATE (PF) 100 MCG/2ML IJ SOLN
INTRAMUSCULAR | Status: DC | PRN
  Administered 2019-08-30 (×2): 50 ug via INTRAVENOUS

## 2019-08-30 MED ORDER — LACTATED RINGERS IV SOLN
INTRAVENOUS | Status: DC
  Administered 2019-08-30: 12:00:00 via INTRAVENOUS

## 2019-08-30 MED ORDER — BUPIVACAINE-EPINEPHRINE 0.25% -1:200000 IJ SOLN
INTRAMUSCULAR | Status: DC | PRN
  Administered 2019-08-30: 20 mL

## 2019-08-30 MED ORDER — FENTANYL CITRATE (PF) 100 MCG/2ML IJ SOLN
INTRAMUSCULAR | Status: AC
  Filled 2019-08-30: qty 2

## 2019-08-30 MED ORDER — PROPOFOL 10 MG/ML IV BOLUS
INTRAVENOUS | Status: DC | PRN
  Administered 2019-08-30: 100 mg via INTRAVENOUS

## 2019-08-30 MED ORDER — GABAPENTIN 300 MG PO CAPS
ORAL_CAPSULE | ORAL | Status: AC
  Administered 2019-08-30: 300 mg via ORAL
  Filled 2019-08-30: qty 1

## 2019-08-30 MED ORDER — LIDOCAINE 2% (20 MG/ML) 5 ML SYRINGE
INTRAMUSCULAR | Status: AC
  Filled 2019-08-30: qty 15

## 2019-08-30 MED ORDER — PHENYLEPHRINE 40 MCG/ML (10ML) SYRINGE FOR IV PUSH (FOR BLOOD PRESSURE SUPPORT)
PREFILLED_SYRINGE | INTRAVENOUS | Status: DC | PRN
  Administered 2019-08-30 (×2): 40 ug via INTRAVENOUS

## 2019-08-30 MED ORDER — GABAPENTIN 300 MG PO CAPS
300.0000 mg | ORAL_CAPSULE | ORAL | Status: AC
  Administered 2019-08-30: 11:00:00 300 mg via ORAL

## 2019-08-30 MED ORDER — ONDANSETRON HCL 4 MG/2ML IJ SOLN
INTRAMUSCULAR | Status: DC | PRN
  Administered 2019-08-30: 4 mg via INTRAVENOUS

## 2019-08-30 MED ORDER — DEXAMETHASONE SODIUM PHOSPHATE 10 MG/ML IJ SOLN
INTRAMUSCULAR | Status: AC
  Filled 2019-08-30: qty 1

## 2019-08-30 SURGICAL SUPPLY — 33 items
APPLIER CLIP 5 13 M/L LIGAMAX5 (MISCELLANEOUS) ×3
CANISTER SUCT 3000ML PPV (MISCELLANEOUS) ×3 IMPLANT
CHLORAPREP W/TINT 26 (MISCELLANEOUS) ×3 IMPLANT
CLIP APPLIE 5 13 M/L LIGAMAX5 (MISCELLANEOUS) ×1 IMPLANT
CONT SPEC 4OZ CLIKSEAL STRL BL (MISCELLANEOUS) ×3 IMPLANT
COVER SURGICAL LIGHT HANDLE (MISCELLANEOUS) ×3 IMPLANT
COVER WAND RF STERILE (DRAPES) ×3 IMPLANT
DERMABOND ADVANCED (GAUZE/BANDAGES/DRESSINGS) ×8
DERMABOND ADVANCED .7 DNX12 (GAUZE/BANDAGES/DRESSINGS) ×4 IMPLANT
ELECT REM PT RETURN 9FT ADLT (ELECTROSURGICAL) ×3
ELECTRODE REM PT RTRN 9FT ADLT (ELECTROSURGICAL) ×1 IMPLANT
GLOVE SURG SIGNA 7.5 PF LTX (GLOVE) ×3 IMPLANT
GOWN STRL REUS W/ TWL LRG LVL3 (GOWN DISPOSABLE) ×4 IMPLANT
GOWN STRL REUS W/ TWL XL LVL3 (GOWN DISPOSABLE) ×1 IMPLANT
GOWN STRL REUS W/TWL LRG LVL3 (GOWN DISPOSABLE) ×8
GOWN STRL REUS W/TWL XL LVL3 (GOWN DISPOSABLE) ×2
KIT BASIN OR (CUSTOM PROCEDURE TRAY) ×3 IMPLANT
KIT TURNOVER KIT B (KITS) ×3 IMPLANT
NS IRRIG 1000ML POUR BTL (IV SOLUTION) ×3 IMPLANT
PAD ARMBOARD 7.5X6 YLW CONV (MISCELLANEOUS) ×3 IMPLANT
POUCH SPECIMEN RETRIEVAL 10MM (ENDOMECHANICALS) ×3 IMPLANT
SCISSORS LAP 5X35 DISP (ENDOMECHANICALS) ×3 IMPLANT
SET IRRIG TUBING LAPAROSCOPIC (IRRIGATION / IRRIGATOR) ×3 IMPLANT
SET TUBE SMOKE EVAC HIGH FLOW (TUBING) ×3 IMPLANT
SLEEVE ENDOPATH XCEL 5M (ENDOMECHANICALS) ×6 IMPLANT
SPECIMEN JAR SMALL (MISCELLANEOUS) IMPLANT
SUT MNCRL AB 4-0 PS2 18 (SUTURE) ×3 IMPLANT
TOWEL GREEN STERILE (TOWEL DISPOSABLE) ×3 IMPLANT
TOWEL GREEN STERILE FF (TOWEL DISPOSABLE) ×3 IMPLANT
TRAY LAPAROSCOPIC MC (CUSTOM PROCEDURE TRAY) ×3 IMPLANT
TROCAR XCEL BLUNT TIP 100MML (ENDOMECHANICALS) ×3 IMPLANT
TROCAR XCEL NON-BLD 5MMX100MML (ENDOMECHANICALS) ×3 IMPLANT
WATER STERILE IRR 1000ML POUR (IV SOLUTION) ×3 IMPLANT

## 2019-08-30 NOTE — Anesthesia Procedure Notes (Signed)
Procedure Name: Intubation Date/Time: 08/30/2019 12:22 PM Performed by: Freddrick March, MD Pre-anesthesia Checklist: Patient identified, Emergency Drugs available, Suction available and Patient being monitored Patient Re-evaluated:Patient Re-evaluated prior to induction Oxygen Delivery Method: Circle System Utilized Preoxygenation: Pre-oxygenation with 100% oxygen Induction Type: IV induction Ventilation: Mask ventilation without difficulty Laryngoscope Size: Mac and 4 Grade View: Grade I Tube type: Oral Tube size: 7.5 mm Number of attempts: 1 Airway Equipment and Method: Stylet and Oral airway Placement Confirmation: ETT inserted through vocal cords under direct vision,  positive ETCO2 and breath sounds checked- equal and bilateral Secured at: 21 cm Tube secured with: Tape Dental Injury: Teeth and Oropharynx as per pre-operative assessment

## 2019-08-30 NOTE — Discharge Instructions (Signed)
[facility] ______[facility] LAPAROSCOPIC SURGERY: POST OP INSTRUCTIONS Always review your discharge instruction sheet given to you by the facility where your surgery was performed. IF YOU HAVE DISABILITY OR FAMILY LEAVE FORMS, YOU MUST BRING THEM TO THE OFFICE FOR PROCESSING.   DO NOT GIVE THEM TO YOUR DOCTOR.  1. A prescription for pain medication may be given to you upon discharge.  Take your pain medication as prescribed, if needed.  If narcotic pain medicine is not needed, then you may take acetaminophen (Tylenol) or ibuprofen (Advil) as needed. 2. Take your usually prescribed medications unless otherwise directed. 3. If you need a refill on your pain medication, please contact your pharmacy.  They will contact our office to request authorization. Prescriptions will not be filled after 5pm or on week-ends. 4. You should follow a light diet the first few days after arrival home, such as soup and crackers, etc.  Be sure to include lots of fluids daily. 5. Most patients will experience some swelling and bruising in the area of the incisions.  Ice packs will help.  Swelling and bruising can take several days to resolve.  6. It is common to experience some constipation if taking pain medication after surgery.  Increasing fluid intake and taking a stool softener (such as Colace) will usually help or prevent this problem from occurring.  A mild laxative (Milk of Magnesia or Miralax) should be taken according to package instructions if there are no bowel movements after 48 hours. 7. Unless discharge instructions indicate otherwise, you may remove your bandages 24-48 hours after surgery, and you may shower at that time.  You may have steri-strips (small skin tapes) in place directly over the incision.  These strips should be left on the skin for 7-10 days.  If your surgeon used skin glue on the incision, you may shower in 24 hours.  The glue will flake off over the next 2-3 weeks.  Any sutures or  staples will be removed at the office during your follow-up visit. 8. ACTIVITIES:  You may resume regular (light) daily activities beginning the next day--such as daily self-care, walking, climbing stairs--gradually increasing activities as tolerated.  You may have sexual intercourse when it is comfortable.  Refrain from any heavy lifting or straining until approved by your doctor. a. You may drive when you are no longer taking prescription pain medication, you can comfortably wear a seatbelt, and you can safely maneuver your car and apply brakes. b. RETURN TO WORK:  __________________________________________________________ 9. You should see your doctor in the office for a follow-up appointment approximately 2-3 weeks after your surgery.  Make sure that you call for this appointment within a day or two after you arrive home to insure a convenient appointment time. 10. OTHER INSTRUCTIONS:OK TO SHOWER STARTING TOMORROW 11. ICE PACK, TYLENOL, IBUPROFEN ALSO FOR PAIN 12. NO LIFTING OVER 15 POUNDS FOR 2 WEEKS __________________________________________________________________________________________________________________________ __________________________________________________________________________________________________________________________ WHEN TO CALL YOUR DOCTOR: 1. Fever over 101.0 2. Inability to urinate 3. Continued bleeding from incision. 4. Increased pain, redness, or drainage from the incision. 5. Increasing abdominal pain  The clinic staff is available to answer your questions during regular business hours.  Please dont hesitate to call and ask to speak to one of the nurses for clinical concerns.  If you have a medical emergency, go to the nearest emergency room or call 911.  A surgeon from [facility] is always on call at the hospital. [address], [place], [employer], [place]  [id number] ? [address], [place], [place]   [phone] ? [phone] ?  FAX [phone] Web site: [email or web address]

## 2019-08-30 NOTE — Transfer of Care (Signed)
Immediate Anesthesia Transfer of Care Note  Patient: Kevin Hoover  Procedure(s) Performed: LAPAROSCOPIC CHOLECYSTECTOMY (N/A )  Patient Location: PACU  Anesthesia Type:General  Level of Consciousness: drowsy  Airway & Oxygen Therapy: Patient Spontanous Breathing and Patient connected to face mask oxygen  Post-op Assessment: Report given to RN and Post -op Vital signs reviewed and stable  Post vital signs: Reviewed and stable  Last Vitals:  Vitals Value Taken Time  BP 189/87 08/30/19 1311  Temp    Pulse 63 08/30/19 1312  Resp 22 08/30/19 1312  SpO2 100 % 08/30/19 1312  Vitals shown include unvalidated device data.  Last Pain:  Vitals:   08/30/19 1055  TempSrc:   PainSc: 0-No pain      Patients Stated Pain Goal: 3 ([id number])  Complications: No apparent anesthesia complications

## 2019-08-30 NOTE — Anesthesia Postprocedure Evaluation (Signed)
Anesthesia Post Note  Patient: Catering manager  Procedure(s) Performed: LAPAROSCOPIC CHOLECYSTECTOMY (N/A )     Patient location during evaluation: PACU Anesthesia Type: General Level of consciousness: awake and alert Pain management: pain level controlled Vital Signs Assessment: post-procedure vital signs reviewed and stable Respiratory status: spontaneous breathing, nonlabored ventilation, respiratory function stable and patient connected to nasal cannula oxygen Cardiovascular status: blood pressure returned to baseline and stable Postop Assessment: no apparent nausea or vomiting Anesthetic complications: no    Last Vitals:  Vitals:   08/30/19 1346 08/30/19 1412  BP: (!) 158/83 (!) 160/70  Pulse: 60 61  Resp: (!) 23   Temp:    SpO2: 93% 96%    Last Pain:  Vitals:   08/30/19 1346  TempSrc:   PainSc: 7                  Chelsey L Woodrum

## 2019-08-30 NOTE — Op Note (Signed)
Laparoscopic Cholecystectomy Procedure Note  Indications: This patient presents with symptomatic gallbladder disease and will undergo laparoscopic cholecystectomy.  Pre-operative Diagnosis: symptomatic cholelithiasis  Post-operative Diagnosis: cholecystitis with cholelithisis  Surgeon: Coralie Keens   Assistants: 0  Anesthesia: General endotracheal anesthesia  ASA Class: 2  Procedure Details  The patient was seen again in the Holding Room. The risks, benefits, complications, treatment options, and expected outcomes were discussed with the patient. The possibilities of reaction to medication, pulmonary aspiration, perforation of viscus, bleeding, recurrent infection, finding a normal gallbladder, the need for additional procedures, failure to diagnose a condition, the possible need to convert to an open procedure, and creating a complication requiring transfusion or operation were discussed with the patient. The likelihood of improving the patient's symptoms with return to their baseline status is good.  The patient and/or family concurred with the proposed plan, giving informed consent. The site of surgery properly noted. The patient was taken to Operating Room, identified as [facility] and the procedure verified as Laparoscopic Cholecystectomy with Intraoperative Cholangiogram. A Time Out was held and the above information confirmed.  Prior to the induction of general anesthesia, antibiotic prophylaxis was administered. General endotracheal anesthesia was then administered and tolerated well. After the induction, the abdomen was prepped with Chloraprep and draped in sterile fashion. The patient was positioned in the supine position.  Local anesthetic agent was injected into the skin near the umbilicus and an incision made. We dissected down to the abdominal fascia with blunt dissection.  The fascia was incised vertically and we entered the peritoneal cavity bluntly.  A pursestring suture  of 0-Vicryl was placed around the fascial opening.  The Hasson cannula was inserted and secured with the stay suture.  Pneumoperitoneum was then created with CO2 and tolerated well without any adverse changes in the patient's vital signs. A 5-mm port was placed in the subxiphoid position.  Two 5-mm ports were placed in the right upper quadrant. All skin incisions were infiltrated with a local anesthetic agent before making the incision and placing the trocars.   We positioned the patient in reverse Trendelenburg, tilted slightly to the patient's left.  The gallbladder was identified and found to be thick walled and inflamed.  The fundus grasped and retracted cephalad. Adhesions were lysed bluntly and with the electrocautery where indicated, taking care not to injure any adjacent organs or viscus. The infundibulum was grasped and retracted laterally, exposing the peritoneum overlying the triangle of Calot. This was then divided and exposed in a blunt fashion. The cystic duct was clearly identified and bluntly dissected circumferentially. A critical view of the cystic duct and cystic artery was obtained.  The cystic duct was then ligated with clips and divided. The cystic artery was, dissected free, ligated with clips and divided as well.   The gallbladder was dissected from the liver bed in retrograde fashion with the electrocautery. The gallbladder was removed and placed in an Endocatch sac. The liver bed was irrigated and inspected. Hemostasis was achieved with the electrocautery. Copious irrigation was utilized and was repeatedly aspirated until clear.  The gallbladder and Endocatch sac were then removed through the umbilical port site.  The pursestring suture was used to close the umbilical fascia.    We again inspected the right upper quadrant for hemostasis.  Pneumoperitoneum was released as we removed the trocars.  4-0 Monocryl was used to close the skin.   Benzoin, steri-strips, and clean dressings were  applied. The patient was then extubated and brought to  the recovery room in stable condition. Instrument, sponge, and needle counts were correct at closure and at the conclusion of the case.   Findings: Cholecystitis with Cholelithiasis  Estimated Blood Loss: Minimal         Drains: 0         Specimens: Gallbladder           Complications: None; patient tolerated the procedure well.         Disposition: PACU - hemodynamically stable.         Condition: stable

## 2019-08-30 NOTE — Anesthesia Preprocedure Evaluation (Addendum)
Anesthesia Evaluation  Patient identified by MRN, date of birth, ID band Patient awake    Reviewed: Allergy & Precautions, NPO status , Patient's Chart, lab work & pertinent test results  History of Anesthesia Complications (+) PONV and history of anesthetic complications  Airway Mallampati: I  TM Distance: >3 FB Neck ROM: Full    Dental no notable dental hx. (+) Teeth Intact, Dental Advisory Given   Pulmonary neg pulmonary ROS,    Pulmonary exam normal breath sounds clear to auscultation       Cardiovascular negative cardio ROS Normal cardiovascular exam Rhythm:Regular Rate:Normal  Stress Test 2020 Lexiscan Myoview stress test 08/09/2019: Lexiscan stress test was performed. Stress EKG is non-diagnostic, as this  is pharmacological stress test. Myocardial perfusion imaging is normal. LVEF 61%. Low risk study.       Neuro/Psych negative neurological ROS  negative psych ROS   GI/Hepatic negative GI ROS, Neg liver ROS,   Endo/Other  negative endocrine ROS  Renal/GU negative Renal ROS  negative genitourinary   Musculoskeletal  (+) Arthritis ,   Abdominal   Peds  Hematology negative hematology ROS (+)   Anesthesia Other Findings   Reproductive/Obstetrics                            Anesthesia Physical Anesthesia Plan  ASA: II  Anesthesia Plan: General   Post-op Pain Management:    Induction: Intravenous  PONV Risk Score and Plan: 3 and Dexamethasone, Ondansetron, Treatment may vary due to age or medical condition and Propofol infusion  Airway Management Planned: Oral ETT  Additional Equipment:   Intra-op Plan:   Post-operative Plan: Extubation in OR  Informed Consent: I have reviewed the patients History and Physical, chart, labs and discussed the procedure including the risks, benefits and alternatives for the proposed anesthesia with the patient or authorized  representative who has indicated his/her understanding and acceptance.     Dental advisory given  Plan Discussed with: CRNA  Anesthesia Plan Comments:        Anesthesia Quick Evaluation

## 2019-08-30 NOTE — Interval H&P Note (Signed)
History and Physical Interval Note:no change in H and P  08/30/2019 10:26 AM  Kevin Hoover  has presented today for surgery, with the diagnosis of SYMPTOMATIC GALLSTONES.  The various methods of treatment have been discussed with the patient and family. After consideration of risks, benefits and other options for treatment, the patient has consented to  Procedure(s): LAPAROSCOPIC CHOLECYSTECTOMY (N/A) as a surgical intervention.  The patient's history has been reviewed, patient examined, no change in status, stable for surgery.  I have reviewed the patient's chart and labs.  Questions were answered to the patient's satisfaction.     Coralie Keens

## 2019-08-31 ENCOUNTER — Encounter (HOSPITAL_COMMUNITY): Payer: Self-pay | Admitting: Surgery

## 2019-08-31 LAB — SURGICAL PATHOLOGY

## 2019-09-08 DIAGNOSIS — Z23 Encounter for immunization: Secondary | ICD-10-CM | POA: Diagnosis not present

## 2019-12-30 ENCOUNTER — Ambulatory Visit: Payer: Medicare Other

## 2020-01-04 ENCOUNTER — Ambulatory Visit: Payer: Medicare Other

## 2020-01-05 ENCOUNTER — Ambulatory Visit: Payer: Medicare Other | Attending: Internal Medicine

## 2020-01-05 DIAGNOSIS — Z23 Encounter for immunization: Secondary | ICD-10-CM | POA: Insufficient documentation

## 2020-01-05 NOTE — Progress Notes (Signed)
   Covid-19 Vaccination Clinic  Name:  Kevin Hoover    MRN: [id number] DOB: [birth year]  01/05/2020  [name] was observed post Covid-19 immunization for 15 minutes without incidence. He was provided with Vaccine Information Sheet and instruction to access the V-Safe system.   [name] was instructed to call 911 with any severe reactions post vaccine: Marland Kitchen Difficulty breathing  . Swelling of your face and throat  . A fast heartbeat  . A bad rash all over your body  . Dizziness and weakness    Immunizations Administered    Name Date Dose VIS Date Route   Pfizer COVID-19 Vaccine 01/05/2020 10:56 AM 0.3 mL 11/10/2019 Intramuscular   Manufacturer: [place]   Lot: [id number]   [place]: [id number]

## 2020-01-09 DIAGNOSIS — H2513 Age-related nuclear cataract, bilateral: Secondary | ICD-10-CM | POA: Diagnosis not present

## 2020-01-09 DIAGNOSIS — H401131 Primary open-angle glaucoma, bilateral, mild stage: Secondary | ICD-10-CM | POA: Diagnosis not present

## 2020-01-30 ENCOUNTER — Ambulatory Visit: Payer: Medicare Other | Attending: Internal Medicine

## 2020-01-30 DIAGNOSIS — Z23 Encounter for immunization: Secondary | ICD-10-CM | POA: Insufficient documentation

## 2020-01-30 NOTE — Progress Notes (Signed)
   Covid-19 Vaccination Clinic  Name:  Kevin Hoover    MRN: [id number] DOB: 1933-04-09  01/30/2020  [name] was observed post Covid-19 immunization for 15 minutes without incident. He was provided with Vaccine Information Sheet and instruction to access the V-Safe system.   [name] was instructed to call 911 with any severe reactions post vaccine: Marland Kitchen Difficulty breathing  . Swelling of face and throat  . A fast heartbeat  . A bad rash all over body  . Dizziness and weakness   Immunizations Administered    Name Date Dose VIS Date Route   Pfizer COVID-19 Vaccine 01/30/2020 11:43 AM 0.3 mL 11/10/2019 Intramuscular   Manufacturer: [place]   Lot: [id number]   [place]: [id number]

## 2020-06-24 DIAGNOSIS — E7849 Other hyperlipidemia: Secondary | ICD-10-CM | POA: Diagnosis not present

## 2020-06-24 DIAGNOSIS — M859 Disorder of bone density and structure, unspecified: Secondary | ICD-10-CM | POA: Diagnosis not present

## 2020-07-01 DIAGNOSIS — E559 Vitamin D deficiency, unspecified: Secondary | ICD-10-CM | POA: Diagnosis not present

## 2020-07-01 DIAGNOSIS — R5383 Other fatigue: Secondary | ICD-10-CM | POA: Diagnosis not present

## 2020-07-01 DIAGNOSIS — Z8546 Personal history of malignant neoplasm of prostate: Secondary | ICD-10-CM | POA: Diagnosis not present

## 2020-07-01 DIAGNOSIS — H6123 Impacted cerumen, bilateral: Secondary | ICD-10-CM | POA: Diagnosis not present

## 2020-07-01 DIAGNOSIS — M8580 Other specified disorders of bone density and structure, unspecified site: Secondary | ICD-10-CM | POA: Diagnosis not present

## 2020-07-01 DIAGNOSIS — Z125 Encounter for screening for malignant neoplasm of prostate: Secondary | ICD-10-CM | POA: Diagnosis not present

## 2020-07-01 DIAGNOSIS — R82998 Other abnormal findings in urine: Secondary | ICD-10-CM | POA: Diagnosis not present

## 2020-07-01 DIAGNOSIS — I2510 Atherosclerotic heart disease of native coronary artery without angina pectoris: Secondary | ICD-10-CM | POA: Diagnosis not present

## 2020-07-01 DIAGNOSIS — E785 Hyperlipidemia, unspecified: Secondary | ICD-10-CM | POA: Diagnosis not present

## 2020-07-01 DIAGNOSIS — G609 Hereditary and idiopathic neuropathy, unspecified: Secondary | ICD-10-CM | POA: Diagnosis not present

## 2020-07-01 DIAGNOSIS — Z0000 Encounter for general adult medical examination without abnormal findings: Secondary | ICD-10-CM | POA: Diagnosis not present

## 2020-07-04 DIAGNOSIS — Z1212 Encounter for screening for malignant neoplasm of rectum: Secondary | ICD-10-CM | POA: Diagnosis not present

## 2020-07-12 DIAGNOSIS — M545 Low back pain, unspecified: Secondary | ICD-10-CM | POA: Insufficient documentation

## 2020-07-12 DIAGNOSIS — I700 Atherosclerosis of aorta: Secondary | ICD-10-CM | POA: Diagnosis not present

## 2020-07-19 DIAGNOSIS — M545 Low back pain: Secondary | ICD-10-CM | POA: Diagnosis not present

## 2020-08-01 DIAGNOSIS — L570 Actinic keratosis: Secondary | ICD-10-CM | POA: Diagnosis not present

## 2020-08-01 DIAGNOSIS — L821 Other seborrheic keratosis: Secondary | ICD-10-CM | POA: Diagnosis not present

## 2020-08-01 DIAGNOSIS — Z85828 Personal history of other malignant neoplasm of skin: Secondary | ICD-10-CM | POA: Diagnosis not present

## 2020-08-08 DIAGNOSIS — H401131 Primary open-angle glaucoma, bilateral, mild stage: Secondary | ICD-10-CM | POA: Diagnosis not present

## 2020-08-08 DIAGNOSIS — H5203 Hypermetropia, bilateral: Secondary | ICD-10-CM | POA: Diagnosis not present

## 2020-08-30 ENCOUNTER — Other Ambulatory Visit: Payer: Self-pay

## 2020-08-30 DIAGNOSIS — I739 Peripheral vascular disease, unspecified: Secondary | ICD-10-CM

## 2020-09-07 DIAGNOSIS — Z23 Encounter for immunization: Secondary | ICD-10-CM | POA: Diagnosis not present

## 2020-09-19 ENCOUNTER — Other Ambulatory Visit: Payer: Self-pay

## 2020-09-19 ENCOUNTER — Ambulatory Visit (INDEPENDENT_AMBULATORY_CARE_PROVIDER_SITE_OTHER): Payer: Medicare Other | Admitting: Vascular Surgery

## 2020-09-19 ENCOUNTER — Ambulatory Visit (HOSPITAL_COMMUNITY)
Admission: RE | Admit: 2020-09-19 | Discharge: 2020-09-19 | Disposition: A | Discharge status: Home / Self Care | Payer: Medicare Other | Source: Ambulatory Visit | Attending: Vascular Surgery | Admitting: Vascular Surgery

## 2020-09-19 ENCOUNTER — Encounter: Payer: Self-pay | Admitting: Vascular Surgery

## 2020-09-19 VITALS — BP 155/87 | HR 65 | Temp 98.7°F | Resp 20 | Ht 70.0 in | Wt 186.6 lb

## 2020-09-19 DIAGNOSIS — I700 Atherosclerosis of aorta: Secondary | ICD-10-CM

## 2020-09-19 DIAGNOSIS — I739 Peripheral vascular disease, unspecified: Secondary | ICD-10-CM

## 2020-09-19 NOTE — Progress Notes (Signed)
Patient name: Kevin Hoover MRN: [id number] DOB: 1933/10/19 Sex: male  REASON FOR CONSULT: Aortic calcification left cramping HPI: Kevin Hoover is a 86 y.o. male, who was recently noted to have calcification of his aorta on a lumbar spine x-ray.  Patient also states that occasionally he does get a cramp in his right calf.  This is not usually associated with walking.  He states he can walk all day long without any problems in his legs.  He has never really smoked.  Overall he is fairly healthy.  He has no family history of aneurysm.  He has no personal history of stroke or coronary artery disease.  Past Medical History:  Diagnosis Date  . Arthritis   . Cancer [facility])    prostate cancer  . Complication of anesthesia   . Glaucoma   . PONV (postoperative nausea and vomiting)    Past Surgical History:  Procedure Laterality Date  . CHOLECYSTECTOMY N/A 08/30/2019   Procedure: LAPAROSCOPIC CHOLECYSTECTOMY;  Surgeon: Coralie Keens, MD;  Location: [facility];  Service: General;  Laterality: N/A;  . COLONOSCOPY    . HEMORRHOID SURGERY  1980s  . PROSTATE SURGERY  1999  . TONSILLECTOMY     age 84    Family History  Problem Relation Age of Onset  . Breast cancer Mother   . Kidney disease Father   . Parkinson's disease Brother     SOCIAL HISTORY: Social History   Socioeconomic History  . Marital status: Married    Spouse name: Not on file  . Number of children: 2  . Years of education: Not on file  . Highest education level: Not on file  Occupational History  . Not on file  Tobacco Use  . Smoking status: Never Smoker  . Smokeless tobacco: Never Used  Vaping Use  . Vaping Use: Never used  Substance and Sexual Activity  . Alcohol use: Not Currently  . Drug use: Not Currently  . Sexual activity: Not Currently  Other Topics Concern  . Not on file  Social History Narrative  . Not on file   Social Determinants of Health   Financial Resource Strain:   . Difficulty of Paying  Living Expenses: Not on file  Food Insecurity:   . Worried About Charity fundraiser in the Last Year: Not on file  . Ran Out of Food in the Last Year: Not on file  Transportation Needs:   . Lack of Transportation (Medical): Not on file  . Lack of Transportation (Non-Medical): Not on file  Physical Activity:   . Days of Exercise per Week: Not on file  . Minutes of Exercise per Session: Not on file  Stress:   . Feeling of Stress : Not on file  Social Connections:   . Frequency of Communication with Friends and Family: Not on file  . Frequency of Social Gatherings with Friends and Family: Not on file  . Attends Religious Services: Not on file  . Active Member of Clubs or Organizations: Not on file  . Attends Archivist Meetings: Not on file  . Marital Status: Not on file  Intimate Partner Violence:   . Fear of Current or Ex-Partner: Not on file  . Emotionally Abused: Not on file  . Physically Abused: Not on file  . Sexually Abused: Not on file    Allergies  Allergen Reactions  . Atorvastatin Calcium     Leg cramps   . Vardenafil Other (See Comments)  headaches    Current Outpatient Medications  Medication Sig Dispense Refill  . polyethylene glycol (MIRALAX / GLYCOLAX) packet Take 21 g by mouth daily as needed for moderate constipation.     . timolol (TIMOPTIC) 0.5 % ophthalmic solution Place 1 drop into both eyes daily.    Marland Kitchen triamcinolone cream (KENALOG) 0.1 % Apply 1 application topically daily as needed (itching).     No current facility-administered medications for this visit.    ROS:   General:  No weight loss, Fever, chills  HEENT: No recent headaches, no nasal bleeding, no visual changes, no sore throat  Neurologic: No dizziness, blackouts, seizures. No recent symptoms of stroke or mini- stroke. No recent episodes of slurred speech, or temporary blindness.  Cardiac: No recent episodes of chest pain/pressure, no shortness of breath at rest.  No  shortness of breath with exertion.  Denies history of atrial fibrillation or irregular heartbeat  Vascular: No history of rest pain in feet.  No history of claudication.  No history of non-healing ulcer, No history of DVT   Pulmonary: No home oxygen, no productive cough, no hemoptysis,  No asthma or wheezing  Musculoskeletal:  [ ]  Arthritis, [ ]  Low back pain,  [ ]  Joint pain  Hematologic:No history of hypercoagulable state.  No history of easy bleeding.  No history of anemia  Gastrointestinal: No hematochezia or melena,  No gastroesophageal reflux, no trouble swallowing  Urinary: [ ]  chronic Kidney disease, [ ]  on HD - [ ]  MWF or [ ]  TTHS, [ ]  Burning with urination, [ ]  Frequent urination, [ ]  Difficulty urinating;   Skin: No rashes  Psychological: No history of anxiety,  No history of depression   Physical Examination  Vitals:   09/19/20 1322  BP: (!) 155/87  Pulse: 65  Resp: 20  Temp: 98.7 F (37.1 C)  SpO2: 97%  Weight: 186 lb 9.6 oz (84.6 kg)  Height: 5\' 10"  (1.778 m)    General:  Alert and oriented, no acute distress HEENT: Normal Neck: No JVD Cardiac: Regular Rate and Rhythm without murmur Abdomen: Soft, non-tender, non-distended, no mass Skin: No rash Extremity Pulses:  2+ radial, brachial, femoral, absent dorsalis pedis, 2+ posterior tibial pulses bilaterally Musculoskeletal: No deformity or edema  Neurologic: Upper and lower extremity motor 5/5 and symmetric  DATA:  She had bilateral ABIs performed today which I reviewed and interpreted.  They were greater than 1 triphasic and normal bilaterally.  Toe pressure was greater than 100.  I also reviewed a CT scan of the abdomen and pelvis from June 2020.  This showed some scattered areas of calcification in the abdominal aorta.  There is some calcification on the left renal artery and around the celiac artery.  There was no evidence of aneurysm.  Aortic diameter was 3 cm.  There was no pelvic or iliac  aneurysm.   ASSESSMENT: Patient with scattered areas of aortic calcification normal variant for person in their 3s.  This can certainly be seen in patients younger and there is no flow-limiting stenosis.  There is no evidence of abdominal aortic aneurysm.  He has no significant peripheral arterial disease on his ABIs on on clinical exam.   PLAN: Patient will follow up on an as-needed basis.  Ruta Hinds, MD [facility] Office: [phone]

## 2020-11-08 DIAGNOSIS — Z23 Encounter for immunization: Secondary | ICD-10-CM | POA: Diagnosis not present

## 2021-03-28 DIAGNOSIS — H401131 Primary open-angle glaucoma, bilateral, mild stage: Secondary | ICD-10-CM | POA: Diagnosis not present

## 2021-04-29 DIAGNOSIS — Z23 Encounter for immunization: Secondary | ICD-10-CM | POA: Diagnosis not present

## 2021-07-24 DIAGNOSIS — Z125 Encounter for screening for malignant neoplasm of prostate: Secondary | ICD-10-CM | POA: Diagnosis not present

## 2021-07-24 DIAGNOSIS — E785 Hyperlipidemia, unspecified: Secondary | ICD-10-CM | POA: Diagnosis not present

## 2021-07-24 DIAGNOSIS — E559 Vitamin D deficiency, unspecified: Secondary | ICD-10-CM | POA: Diagnosis not present

## 2021-07-24 DIAGNOSIS — M8589 Other specified disorders of bone density and structure, multiple sites: Secondary | ICD-10-CM | POA: Diagnosis not present

## 2021-07-30 DIAGNOSIS — Z1212 Encounter for screening for malignant neoplasm of rectum: Secondary | ICD-10-CM | POA: Diagnosis not present

## 2021-07-30 DIAGNOSIS — R82998 Other abnormal findings in urine: Secondary | ICD-10-CM | POA: Diagnosis not present

## 2021-08-07 DIAGNOSIS — E559 Vitamin D deficiency, unspecified: Secondary | ICD-10-CM | POA: Diagnosis not present

## 2021-08-07 DIAGNOSIS — K5900 Constipation, unspecified: Secondary | ICD-10-CM | POA: Diagnosis not present

## 2021-08-07 DIAGNOSIS — Z1331 Encounter for screening for depression: Secondary | ICD-10-CM | POA: Diagnosis not present

## 2021-08-07 DIAGNOSIS — E785 Hyperlipidemia, unspecified: Secondary | ICD-10-CM | POA: Diagnosis not present

## 2021-08-07 DIAGNOSIS — R413 Other amnesia: Secondary | ICD-10-CM | POA: Diagnosis not present

## 2021-08-07 DIAGNOSIS — I2510 Atherosclerotic heart disease of native coronary artery without angina pectoris: Secondary | ICD-10-CM | POA: Diagnosis not present

## 2021-08-07 DIAGNOSIS — Z1339 Encounter for screening examination for other mental health and behavioral disorders: Secondary | ICD-10-CM | POA: Diagnosis not present

## 2021-08-07 DIAGNOSIS — M8580 Other specified disorders of bone density and structure, unspecified site: Secondary | ICD-10-CM | POA: Diagnosis not present

## 2021-08-07 DIAGNOSIS — Z23 Encounter for immunization: Secondary | ICD-10-CM | POA: Diagnosis not present

## 2021-08-07 DIAGNOSIS — G609 Hereditary and idiopathic neuropathy, unspecified: Secondary | ICD-10-CM | POA: Diagnosis not present

## 2021-08-07 DIAGNOSIS — Z0000 Encounter for general adult medical examination without abnormal findings: Secondary | ICD-10-CM | POA: Diagnosis not present

## 2021-08-07 DIAGNOSIS — Z8546 Personal history of malignant neoplasm of prostate: Secondary | ICD-10-CM | POA: Diagnosis not present

## 2021-08-07 DIAGNOSIS — L570 Actinic keratosis: Secondary | ICD-10-CM | POA: Diagnosis not present

## 2021-08-07 DIAGNOSIS — R7301 Impaired fasting glucose: Secondary | ICD-10-CM | POA: Diagnosis not present

## 2021-08-21 DIAGNOSIS — Z85828 Personal history of other malignant neoplasm of skin: Secondary | ICD-10-CM | POA: Diagnosis not present

## 2021-08-21 DIAGNOSIS — L821 Other seborrheic keratosis: Secondary | ICD-10-CM | POA: Diagnosis not present

## 2021-08-21 DIAGNOSIS — L2089 Other atopic dermatitis: Secondary | ICD-10-CM | POA: Diagnosis not present

## 2021-08-21 DIAGNOSIS — L570 Actinic keratosis: Secondary | ICD-10-CM | POA: Diagnosis not present

## 2021-08-25 DIAGNOSIS — Z23 Encounter for immunization: Secondary | ICD-10-CM | POA: Diagnosis not present

## 2021-09-02 DIAGNOSIS — R413 Other amnesia: Secondary | ICD-10-CM | POA: Diagnosis not present

## 2021-09-02 DIAGNOSIS — R634 Abnormal weight loss: Secondary | ICD-10-CM | POA: Diagnosis not present

## 2021-10-07 DIAGNOSIS — H401131 Primary open-angle glaucoma, bilateral, mild stage: Secondary | ICD-10-CM | POA: Diagnosis not present

## 2021-10-07 DIAGNOSIS — H524 Presbyopia: Secondary | ICD-10-CM | POA: Diagnosis not present

## 2021-10-07 DIAGNOSIS — H2513 Age-related nuclear cataract, bilateral: Secondary | ICD-10-CM | POA: Diagnosis not present

## 2021-10-07 DIAGNOSIS — H5203 Hypermetropia, bilateral: Secondary | ICD-10-CM | POA: Diagnosis not present

## 2021-10-08 DIAGNOSIS — R413 Other amnesia: Secondary | ICD-10-CM | POA: Diagnosis not present

## 2021-10-13 DIAGNOSIS — Z20828 Contact with and (suspected) exposure to other viral communicable diseases: Secondary | ICD-10-CM | POA: Diagnosis not present

## 2021-11-04 DIAGNOSIS — Z20822 Contact with and (suspected) exposure to covid-19: Secondary | ICD-10-CM | POA: Diagnosis not present

## 2021-11-05 DIAGNOSIS — Z20822 Contact with and (suspected) exposure to covid-19: Secondary | ICD-10-CM | POA: Diagnosis not present

## 2022-02-06 DIAGNOSIS — M129 Arthropathy, unspecified: Secondary | ICD-10-CM | POA: Diagnosis not present

## 2022-02-06 DIAGNOSIS — E875 Hyperkalemia: Secondary | ICD-10-CM | POA: Diagnosis not present

## 2022-02-06 DIAGNOSIS — R413 Other amnesia: Secondary | ICD-10-CM | POA: Diagnosis not present

## 2022-02-06 DIAGNOSIS — D72829 Elevated white blood cell count, unspecified: Secondary | ICD-10-CM | POA: Diagnosis not present

## 2022-02-06 DIAGNOSIS — G609 Hereditary and idiopathic neuropathy, unspecified: Secondary | ICD-10-CM | POA: Diagnosis not present

## 2022-02-06 DIAGNOSIS — I2510 Atherosclerotic heart disease of native coronary artery without angina pectoris: Secondary | ICD-10-CM | POA: Diagnosis not present

## 2022-02-06 DIAGNOSIS — E785 Hyperlipidemia, unspecified: Secondary | ICD-10-CM | POA: Diagnosis not present

## 2022-02-06 DIAGNOSIS — R7301 Impaired fasting glucose: Secondary | ICD-10-CM | POA: Diagnosis not present

## 2022-02-06 DIAGNOSIS — Z8546 Personal history of malignant neoplasm of prostate: Secondary | ICD-10-CM | POA: Diagnosis not present

## 2022-02-06 DIAGNOSIS — M8580 Other specified disorders of bone density and structure, unspecified site: Secondary | ICD-10-CM | POA: Diagnosis not present

## 2022-02-06 DIAGNOSIS — Z23 Encounter for immunization: Secondary | ICD-10-CM | POA: Diagnosis not present

## 2022-03-16 DIAGNOSIS — Z20822 Contact with and (suspected) exposure to covid-19: Secondary | ICD-10-CM | POA: Diagnosis not present

## 2022-03-17 DIAGNOSIS — Z129 Encounter for screening for malignant neoplasm, site unspecified: Secondary | ICD-10-CM | POA: Diagnosis not present

## 2022-03-17 DIAGNOSIS — L821 Other seborrheic keratosis: Secondary | ICD-10-CM | POA: Diagnosis not present

## 2022-03-17 DIAGNOSIS — Z85828 Personal history of other malignant neoplasm of skin: Secondary | ICD-10-CM | POA: Diagnosis not present

## 2022-03-17 DIAGNOSIS — L570 Actinic keratosis: Secondary | ICD-10-CM | POA: Diagnosis not present

## 2022-03-25 DIAGNOSIS — Z20822 Contact with and (suspected) exposure to covid-19: Secondary | ICD-10-CM | POA: Diagnosis not present

## 2022-04-06 DIAGNOSIS — Z20822 Contact with and (suspected) exposure to covid-19: Secondary | ICD-10-CM | POA: Diagnosis not present

## 2022-04-10 DIAGNOSIS — H401131 Primary open-angle glaucoma, bilateral, mild stage: Secondary | ICD-10-CM | POA: Diagnosis not present

## 2022-09-09 DIAGNOSIS — E559 Vitamin D deficiency, unspecified: Secondary | ICD-10-CM | POA: Diagnosis not present

## 2022-09-09 DIAGNOSIS — E785 Hyperlipidemia, unspecified: Secondary | ICD-10-CM | POA: Diagnosis not present

## 2022-09-09 DIAGNOSIS — R5383 Other fatigue: Secondary | ICD-10-CM | POA: Diagnosis not present

## 2022-09-09 DIAGNOSIS — R7989 Other specified abnormal findings of blood chemistry: Secondary | ICD-10-CM | POA: Diagnosis not present

## 2022-09-09 DIAGNOSIS — Z125 Encounter for screening for malignant neoplasm of prostate: Secondary | ICD-10-CM | POA: Diagnosis not present

## 2022-09-17 DIAGNOSIS — Z1331 Encounter for screening for depression: Secondary | ICD-10-CM | POA: Diagnosis not present

## 2022-09-17 DIAGNOSIS — Z1339 Encounter for screening examination for other mental health and behavioral disorders: Secondary | ICD-10-CM | POA: Diagnosis not present

## 2022-09-17 DIAGNOSIS — R82998 Other abnormal findings in urine: Secondary | ICD-10-CM | POA: Diagnosis not present

## 2022-09-17 DIAGNOSIS — Z23 Encounter for immunization: Secondary | ICD-10-CM | POA: Diagnosis not present

## 2022-09-17 DIAGNOSIS — R413 Other amnesia: Secondary | ICD-10-CM | POA: Diagnosis not present

## 2022-09-17 DIAGNOSIS — E875 Hyperkalemia: Secondary | ICD-10-CM | POA: Diagnosis not present

## 2022-09-17 DIAGNOSIS — Z0000 Encounter for general adult medical examination without abnormal findings: Secondary | ICD-10-CM | POA: Diagnosis not present

## 2022-09-17 DIAGNOSIS — Z8546 Personal history of malignant neoplasm of prostate: Secondary | ICD-10-CM | POA: Diagnosis not present

## 2022-09-17 DIAGNOSIS — I2510 Atherosclerotic heart disease of native coronary artery without angina pectoris: Secondary | ICD-10-CM | POA: Diagnosis not present

## 2022-09-22 DIAGNOSIS — L820 Inflamed seborrheic keratosis: Secondary | ICD-10-CM | POA: Diagnosis not present

## 2022-09-22 DIAGNOSIS — L821 Other seborrheic keratosis: Secondary | ICD-10-CM | POA: Diagnosis not present

## 2022-09-22 DIAGNOSIS — Z85828 Personal history of other malignant neoplasm of skin: Secondary | ICD-10-CM | POA: Diagnosis not present

## 2022-09-22 DIAGNOSIS — L570 Actinic keratosis: Secondary | ICD-10-CM | POA: Diagnosis not present

## 2022-09-22 DIAGNOSIS — E663 Overweight: Secondary | ICD-10-CM | POA: Diagnosis not present

## 2022-10-06 DIAGNOSIS — H401131 Primary open-angle glaucoma, bilateral, mild stage: Secondary | ICD-10-CM | POA: Diagnosis not present

## 2022-10-06 DIAGNOSIS — H5203 Hypermetropia, bilateral: Secondary | ICD-10-CM | POA: Diagnosis not present

## 2022-10-06 DIAGNOSIS — H2513 Age-related nuclear cataract, bilateral: Secondary | ICD-10-CM | POA: Diagnosis not present

## 2022-10-16 DIAGNOSIS — Z23 Encounter for immunization: Secondary | ICD-10-CM | POA: Diagnosis not present

## 2023-03-11 DIAGNOSIS — Z85828 Personal history of other malignant neoplasm of skin: Secondary | ICD-10-CM | POA: Diagnosis not present

## 2023-03-11 DIAGNOSIS — L821 Other seborrheic keratosis: Secondary | ICD-10-CM | POA: Diagnosis not present

## 2023-03-11 DIAGNOSIS — L570 Actinic keratosis: Secondary | ICD-10-CM | POA: Diagnosis not present

## 2023-03-11 DIAGNOSIS — Z129 Encounter for screening for malignant neoplasm, site unspecified: Secondary | ICD-10-CM | POA: Diagnosis not present

## 2023-03-30 DIAGNOSIS — E559 Vitamin D deficiency, unspecified: Secondary | ICD-10-CM | POA: Diagnosis not present

## 2023-03-30 DIAGNOSIS — I2510 Atherosclerotic heart disease of native coronary artery without angina pectoris: Secondary | ICD-10-CM | POA: Diagnosis not present

## 2023-03-30 DIAGNOSIS — D72829 Elevated white blood cell count, unspecified: Secondary | ICD-10-CM | POA: Diagnosis not present

## 2023-03-30 DIAGNOSIS — R413 Other amnesia: Secondary | ICD-10-CM | POA: Diagnosis not present

## 2023-03-30 DIAGNOSIS — M542 Cervicalgia: Secondary | ICD-10-CM | POA: Diagnosis not present

## 2023-03-30 DIAGNOSIS — C61 Malignant neoplasm of prostate: Secondary | ICD-10-CM | POA: Diagnosis not present

## 2023-03-30 DIAGNOSIS — K5900 Constipation, unspecified: Secondary | ICD-10-CM | POA: Diagnosis not present

## 2023-03-30 DIAGNOSIS — R7301 Impaired fasting glucose: Secondary | ICD-10-CM | POA: Diagnosis not present

## 2023-03-30 DIAGNOSIS — E875 Hyperkalemia: Secondary | ICD-10-CM | POA: Diagnosis not present

## 2023-03-30 DIAGNOSIS — M8580 Other specified disorders of bone density and structure, unspecified site: Secondary | ICD-10-CM | POA: Diagnosis not present

## 2023-03-30 DIAGNOSIS — D126 Benign neoplasm of colon, unspecified: Secondary | ICD-10-CM | POA: Diagnosis not present

## 2023-03-30 DIAGNOSIS — E785 Hyperlipidemia, unspecified: Secondary | ICD-10-CM | POA: Diagnosis not present

## 2023-03-30 DIAGNOSIS — L570 Actinic keratosis: Secondary | ICD-10-CM | POA: Diagnosis not present

## 2023-03-30 DIAGNOSIS — H409 Unspecified glaucoma: Secondary | ICD-10-CM | POA: Diagnosis not present

## 2023-03-30 DIAGNOSIS — G609 Hereditary and idiopathic neuropathy, unspecified: Secondary | ICD-10-CM | POA: Diagnosis not present

## 2023-03-30 DIAGNOSIS — Z8546 Personal history of malignant neoplasm of prostate: Secondary | ICD-10-CM | POA: Diagnosis not present

## 2023-04-06 DIAGNOSIS — H2513 Age-related nuclear cataract, bilateral: Secondary | ICD-10-CM | POA: Diagnosis not present

## 2023-04-06 DIAGNOSIS — H401131 Primary open-angle glaucoma, bilateral, mild stage: Secondary | ICD-10-CM | POA: Diagnosis not present

## 2023-04-21 ENCOUNTER — Ambulatory Visit: Payer: Medicare Other | Attending: Internal Medicine

## 2023-04-21 DIAGNOSIS — R293 Abnormal posture: Secondary | ICD-10-CM | POA: Diagnosis not present

## 2023-04-21 DIAGNOSIS — M542 Cervicalgia: Secondary | ICD-10-CM | POA: Diagnosis not present

## 2023-04-21 NOTE — Therapy (Signed)
OUTPATIENT PHYSICAL THERAPY CERVICAL EVALUATION   Patient Name: Kevin Hoover MRN: [id number] DOB:09/23/1933, 89 y.o., male Today's Date: 04/21/2023  END OF SESSION:  PT End of Session - 04/21/23 1359     Visit Number 1    Date for PT Re-Evaluation 06/16/23    PT Start Time 1400    PT Stop Time 1445    PT Time Calculation (min) 45 min    Activity Tolerance Patient tolerated treatment well    Behavior During Therapy WFL for tasks assessed/performed             Past Medical History:  Diagnosis Date   Arthritis    Cancer (HCC)    prostate cancer   Complication of anesthesia    Glaucoma    PONV (postoperative nausea and vomiting)    Past Surgical History:  Procedure Laterality Date   CHOLECYSTECTOMY N/A 08/30/2019   Procedure: LAPAROSCOPIC CHOLECYSTECTOMY;  Surgeon: Abigail Miyamoto, MD;  Location: [facility] OR;  Service: General;  Laterality: N/A;   COLONOSCOPY     HEMORRHOID SURGERY  1980s   PROSTATE SURGERY  63   TONSILLECTOMY     age 67   Patient Active Problem List   Diagnosis Date Noted   Low back pain 07/12/2020   Preop cardiovascular exam 07/24/2019   Cancer (HCC) 07/24/2019   ED (erectile dysfunction) 07/24/2019   Hyperlipidemia 07/24/2019   Prostate cancer (HCC) 07/24/2019   Unexplained night sweats 11/03/2018   Viral syndrome 11/03/2018   Biliary sludge determined by ultrasound 11/03/2018   Cataracts, bilateral 08/15/2012   Glaucoma 08/15/2012    PCP: Rodrigo Ran  REFERRING PROVIDER: Rodrigo Ran  REFERRING DIAG: M54.2  THERAPY DIAG:  No diagnosis found.  Rationale for Evaluation and Treatment: Rehabilitation  ONSET DATE: 03/31/23  SUBJECTIVE:                                                                                                                                                                                                         SUBJECTIVE STATEMENT: Just sitting right here it does not hurt, but when I turn my head it hurts. It is  way better than it was and think it is almost healed.    PERTINENT HISTORY:  Arthritis, hx of prostate cancer   PAIN:  Are you having pain? Yes: NPRS scale: 3/10 Pain location: neck  Pain description: stiff, tight Aggravating factors: turning my head Relieving factors: nothing it seems to be healing on it's own  PRECAUTIONS: None  WEIGHT BEARING RESTRICTIONS: No  FALLS:  Has patient fallen in last  6 months? No  LIVING ENVIRONMENT: Lives with: lives with their spouse Lives in: House/apartment Stairs: No Has following equipment at home: None  OCCUPATION: Retired  PLOF: Independent  PATIENT GOALS: be able to turn my head all the way and with no pain    OBJECTIVE:   DIAGNOSTIC FINDINGS:  none  PATIENT SURVEYS:  FOTO 67  COGNITION: Overall cognitive status: Within functional limits for tasks assessed  SENSATION: WFL  POSTURE: rounded shoulders  PALPATION: Tightness in c-spine mm, UT, some scapular winging  CERVICAL ROM:   Active ROM A/PROM (deg) eval  Flexion WFL  Extension 75% with a tad pain  Right lateral flexion Very limited, hardly any movement  Left lateral flexion Very limited, hardly any movement  Right rotation 50%   Left rotation 25% with pain   (Blank rows = not tested)  UPPER EXTREMITY ROM: grossly WFL    UPPER EXTREMITY MMT: grossly 5/5    TODAY'S TREATMENT:                                                                                                                              DATE: 04/21/23- EVAL    PATIENT EDUCATION:  Education details: POC and HEP Person educated: Patient Education method: Explanation Education comprehension: verbalized understanding  HOME EXERCISE PROGRAM: Access Code: [email or web address] URL: [email or web address] Date: 04/21/2023 Prepared by: Cassie Freer  Exercises - Seated Upper Trapezius Stretch  - 1 x daily - 7 x weekly - 2 sets - 30 hold - Seated Levator Scapulae Stretch  - 1 x daily - 7 x  weekly - 2 sets - 30 hold - Seated Assisted Cervical Rotation with Towel  - 1 x daily - 7 x weekly - 2 sets - 10 reps - Cervical Extension AROM with Strap  - 1 x daily - 7 x weekly - 2 sets - 10 reps - Cervical Retraction with Resistance  - 1 x daily - 7 x weekly - 2 sets - 10 reps  ASSESSMENT:  CLINICAL IMPRESSION: Patient is a 89 y.o. male who was seen today for physical therapy evaluation and treatment for neck pain. He reports his pain has significantly improved since he first called to make an appointment. He does have limitations in his cervical ROM, especially with left rotation. States it is difficult turning his head when driving. He has tightness in his upper traps and cervical paraspinals but did not find any trigger points. We went over some exercises to try working on mobilizations with movements and stretches. He reports his neck already feels better at end of visit. Patient wants to try these exercises for a week or so before he decides if he wants to return for additional PT sessions.   OBJECTIVE IMPAIRMENTS: decreased ROM and pain.   REHAB POTENTIAL: Good  CLINICAL DECISION MAKING: Stable/uncomplicated  EVALUATION COMPLEXITY: Low   GOALS: Goals reviewed with patient? Yes  SHORT TERM GOALS: Target date: 05/19/23  Patient will be  independent with initial HEP.  Goal status: INITIAL   LONG TERM GOALS: Target date: 06/16/23  Patient will be independent with advanced/ongoing HEP to improve outcomes and carryover.  Goal status: INITIAL  2.  Patient will report 75% improvement in neck pain to improve QOL.  Baseline: 3/10 Goal status: INITIAL  3.  Patient will demonstrate full pain free cervical ROM for safety with driving.  Baseline: see chart above Goal status: INITIAL  4.  Patient will demonstrate improved posture to decrease muscle imbalance. Goal status: INITIAL    PLAN:  PT FREQUENCY: 1x/week  PT DURATION: 8 weeks  PLANNED INTERVENTIONS: Therapeutic  exercises, Therapeutic activity, Neuromuscular re-education, Balance training, Gait training, Patient/Family education, Self Care, Joint mobilization, Dry Needling, Electrical stimulation, Spinal mobilization, Moist heat, Traction, and Manual therapy  PLAN FOR NEXT SESSION: PROM for neck, stretching, mobilizations to improve neck ROM   Cassie Freer, PT 04/21/2023, 2:40 PM

## 2023-08-20 DIAGNOSIS — Z23 Encounter for immunization: Secondary | ICD-10-CM | POA: Diagnosis not present

## 2023-09-14 DIAGNOSIS — Z85828 Personal history of other malignant neoplasm of skin: Secondary | ICD-10-CM | POA: Diagnosis not present

## 2023-09-14 DIAGNOSIS — Z129 Encounter for screening for malignant neoplasm, site unspecified: Secondary | ICD-10-CM | POA: Diagnosis not present

## 2023-09-14 DIAGNOSIS — L821 Other seborrheic keratosis: Secondary | ICD-10-CM | POA: Diagnosis not present

## 2023-09-14 DIAGNOSIS — D485 Neoplasm of uncertain behavior of skin: Secondary | ICD-10-CM | POA: Diagnosis not present

## 2023-09-14 DIAGNOSIS — L570 Actinic keratosis: Secondary | ICD-10-CM | POA: Diagnosis not present

## 2023-09-14 DIAGNOSIS — C44519 Basal cell carcinoma of skin of other part of trunk: Secondary | ICD-10-CM | POA: Diagnosis not present

## 2023-10-12 DIAGNOSIS — H5203 Hypermetropia, bilateral: Secondary | ICD-10-CM | POA: Diagnosis not present

## 2023-10-12 DIAGNOSIS — H401131 Primary open-angle glaucoma, bilateral, mild stage: Secondary | ICD-10-CM | POA: Diagnosis not present

## 2023-11-09 DIAGNOSIS — E559 Vitamin D deficiency, unspecified: Secondary | ICD-10-CM | POA: Diagnosis not present

## 2023-11-09 DIAGNOSIS — D72829 Elevated white blood cell count, unspecified: Secondary | ICD-10-CM | POA: Diagnosis not present

## 2023-11-09 DIAGNOSIS — E785 Hyperlipidemia, unspecified: Secondary | ICD-10-CM | POA: Diagnosis not present

## 2023-11-17 DIAGNOSIS — M129 Arthropathy, unspecified: Secondary | ICD-10-CM | POA: Diagnosis not present

## 2023-11-17 DIAGNOSIS — R413 Other amnesia: Secondary | ICD-10-CM | POA: Diagnosis not present

## 2023-11-17 DIAGNOSIS — E785 Hyperlipidemia, unspecified: Secondary | ICD-10-CM | POA: Diagnosis not present

## 2023-11-17 DIAGNOSIS — G609 Hereditary and idiopathic neuropathy, unspecified: Secondary | ICD-10-CM | POA: Diagnosis not present

## 2023-11-17 DIAGNOSIS — Z0000 Encounter for general adult medical examination without abnormal findings: Secondary | ICD-10-CM | POA: Diagnosis not present

## 2023-11-17 DIAGNOSIS — M8580 Other specified disorders of bone density and structure, unspecified site: Secondary | ICD-10-CM | POA: Diagnosis not present

## 2023-11-17 DIAGNOSIS — R7301 Impaired fasting glucose: Secondary | ICD-10-CM | POA: Diagnosis not present

## 2023-11-17 DIAGNOSIS — R82998 Other abnormal findings in urine: Secondary | ICD-10-CM | POA: Diagnosis not present

## 2023-11-17 DIAGNOSIS — C61 Malignant neoplasm of prostate: Secondary | ICD-10-CM | POA: Diagnosis not present

## 2023-11-17 DIAGNOSIS — I2510 Atherosclerotic heart disease of native coronary artery without angina pectoris: Secondary | ICD-10-CM | POA: Diagnosis not present

## 2023-11-17 DIAGNOSIS — H409 Unspecified glaucoma: Secondary | ICD-10-CM | POA: Diagnosis not present

## 2023-11-17 DIAGNOSIS — E559 Vitamin D deficiency, unspecified: Secondary | ICD-10-CM | POA: Diagnosis not present

## 2023-12-16 DIAGNOSIS — Z85828 Personal history of other malignant neoplasm of skin: Secondary | ICD-10-CM | POA: Diagnosis not present

## 2023-12-16 DIAGNOSIS — C4441 Basal cell carcinoma of skin of scalp and neck: Secondary | ICD-10-CM | POA: Diagnosis not present

## 2023-12-16 DIAGNOSIS — L570 Actinic keratosis: Secondary | ICD-10-CM | POA: Diagnosis not present

## 2024-03-16 DIAGNOSIS — L570 Actinic keratosis: Secondary | ICD-10-CM | POA: Diagnosis not present

## 2024-03-16 DIAGNOSIS — Z85828 Personal history of other malignant neoplasm of skin: Secondary | ICD-10-CM | POA: Diagnosis not present

## 2024-03-16 DIAGNOSIS — Z129 Encounter for screening for malignant neoplasm, site unspecified: Secondary | ICD-10-CM | POA: Diagnosis not present

## 2024-03-16 DIAGNOSIS — L821 Other seborrheic keratosis: Secondary | ICD-10-CM | POA: Diagnosis not present

## 2024-07-11 DIAGNOSIS — E785 Hyperlipidemia, unspecified: Secondary | ICD-10-CM | POA: Diagnosis not present

## 2024-07-11 DIAGNOSIS — I2510 Atherosclerotic heart disease of native coronary artery without angina pectoris: Secondary | ICD-10-CM | POA: Diagnosis not present

## 2024-07-11 DIAGNOSIS — M129 Arthropathy, unspecified: Secondary | ICD-10-CM | POA: Diagnosis not present

## 2024-07-11 DIAGNOSIS — H409 Unspecified glaucoma: Secondary | ICD-10-CM | POA: Diagnosis not present

## 2024-07-11 DIAGNOSIS — E559 Vitamin D deficiency, unspecified: Secondary | ICD-10-CM | POA: Diagnosis not present

## 2024-07-11 DIAGNOSIS — R7301 Impaired fasting glucose: Secondary | ICD-10-CM | POA: Diagnosis not present

## 2024-07-11 DIAGNOSIS — G609 Hereditary and idiopathic neuropathy, unspecified: Secondary | ICD-10-CM | POA: Diagnosis not present

## 2024-07-11 DIAGNOSIS — M8589 Other specified disorders of bone density and structure, multiple sites: Secondary | ICD-10-CM | POA: Diagnosis not present

## 2024-07-11 DIAGNOSIS — F0390 Unspecified dementia without behavioral disturbance: Secondary | ICD-10-CM | POA: Diagnosis not present

## 2024-07-11 DIAGNOSIS — L570 Actinic keratosis: Secondary | ICD-10-CM | POA: Diagnosis not present

## 2024-07-11 DIAGNOSIS — M8580 Other specified disorders of bone density and structure, unspecified site: Secondary | ICD-10-CM | POA: Diagnosis not present

## 2024-07-11 DIAGNOSIS — Z8546 Personal history of malignant neoplasm of prostate: Secondary | ICD-10-CM | POA: Diagnosis not present

## 2024-08-31 DIAGNOSIS — C44329 Squamous cell carcinoma of skin of other parts of face: Secondary | ICD-10-CM | POA: Diagnosis not present

## 2024-08-31 DIAGNOSIS — D485 Neoplasm of uncertain behavior of skin: Secondary | ICD-10-CM | POA: Diagnosis not present

## 2024-09-28 DIAGNOSIS — L821 Other seborrheic keratosis: Secondary | ICD-10-CM | POA: Diagnosis not present

## 2024-09-28 DIAGNOSIS — Z85828 Personal history of other malignant neoplasm of skin: Secondary | ICD-10-CM | POA: Diagnosis not present

## 2024-09-28 DIAGNOSIS — Z129 Encounter for screening for malignant neoplasm, site unspecified: Secondary | ICD-10-CM | POA: Diagnosis not present

## 2024-09-28 DIAGNOSIS — Z23 Encounter for immunization: Secondary | ICD-10-CM | POA: Diagnosis not present

## 2024-09-28 DIAGNOSIS — L570 Actinic keratosis: Secondary | ICD-10-CM | POA: Diagnosis not present

## 2024-10-03 DIAGNOSIS — C44329 Squamous cell carcinoma of skin of other parts of face: Secondary | ICD-10-CM | POA: Diagnosis not present
